# Patient Record
Sex: Female | Born: 1965
Health system: Southern US, Community
[De-identification: ages and names within clinical notes are randomized; demographics above are authoritative.]

## PROBLEM LIST (undated history)

## (undated) DIAGNOSIS — G4700 Insomnia, unspecified: Secondary | ICD-10-CM

## (undated) DIAGNOSIS — E559 Vitamin D deficiency, unspecified: Secondary | ICD-10-CM

## (undated) DIAGNOSIS — E669 Obesity, unspecified: Secondary | ICD-10-CM

## (undated) DIAGNOSIS — H269 Unspecified cataract: Secondary | ICD-10-CM

## (undated) DIAGNOSIS — E785 Hyperlipidemia, unspecified: Secondary | ICD-10-CM

## (undated) DIAGNOSIS — I10 Essential (primary) hypertension: Secondary | ICD-10-CM

## (undated) DIAGNOSIS — M1990 Unspecified osteoarthritis, unspecified site: Secondary | ICD-10-CM

## (undated) DIAGNOSIS — M549 Dorsalgia, unspecified: Secondary | ICD-10-CM

## (undated) DIAGNOSIS — N920 Excessive and frequent menstruation with regular cycle: Secondary | ICD-10-CM

## (undated) HISTORY — DX: Excessive and frequent menstruation with regular cycle: N92.0

## (undated) HISTORY — DX: Unspecified cataract: H26.9

## (undated) HISTORY — DX: Obesity, unspecified: E66.9

## (undated) HISTORY — DX: Vitamin D deficiency, unspecified: E55.9

## (undated) HISTORY — DX: Unspecified osteoarthritis, unspecified site: M19.90

## (undated) HISTORY — DX: Hyperlipidemia, unspecified: E78.5

## (undated) HISTORY — PX: CERVICAL CERCLAGE: SHX1329

## (undated) HISTORY — DX: Dorsalgia, unspecified: M54.9

## (undated) HISTORY — DX: Essential (primary) hypertension: I10

## (undated) HISTORY — PX: EYE SURGERY: SHX253

## (undated) HISTORY — PX: OTHER SURGICAL HISTORY: SHX169

## (undated) HISTORY — DX: Insomnia, unspecified: G47.00

## (undated) HISTORY — PX: BREAST SURGERY: SHX581

---

## 2005-09-06 ENCOUNTER — Ambulatory Visit: Payer: Self-pay | Admitting: Ophthalmology

## 2005-09-10 ENCOUNTER — Ambulatory Visit: Payer: Self-pay | Admitting: Ophthalmology

## 2006-01-07 HISTORY — PX: DILATION AND CURETTAGE OF UTERUS: SHX78

## 2006-06-18 ENCOUNTER — Ambulatory Visit: Payer: Self-pay | Admitting: Family Medicine

## 2006-08-12 ENCOUNTER — Ambulatory Visit: Payer: Self-pay

## 2006-08-14 ENCOUNTER — Ambulatory Visit: Payer: Self-pay

## 2009-03-04 ENCOUNTER — Emergency Department: Payer: Self-pay | Admitting: Emergency Medicine

## 2010-02-23 ENCOUNTER — Ambulatory Visit: Payer: Self-pay | Admitting: Ophthalmology

## 2010-03-06 ENCOUNTER — Ambulatory Visit: Payer: Self-pay | Admitting: Ophthalmology

## 2010-09-12 ENCOUNTER — Ambulatory Visit: Payer: Self-pay

## 2011-01-08 LAB — LIPID PANEL
Cholesterol: 189 mg/dL (ref 0–200)
HDL: 58 mg/dL (ref 35–70)
LDL Cholesterol: 114 mg/dL
Triglycerides: 87 mg/dL (ref 40–160)

## 2011-10-08 LAB — HM PAP SMEAR: HM Pap smear: NORMAL

## 2011-11-13 ENCOUNTER — Ambulatory Visit: Payer: Self-pay | Admitting: Obstetrics and Gynecology

## 2012-05-05 ENCOUNTER — Ambulatory Visit: Payer: Self-pay | Admitting: Ophthalmology

## 2014-08-31 ENCOUNTER — Other Ambulatory Visit: Payer: Self-pay | Admitting: Family Medicine

## 2014-08-31 MED ORDER — TEMAZEPAM 15 MG PO CAPS: 15.0000 mg | ORAL_CAPSULE | Freq: Every evening | ORAL | Status: DC | PRN

## 2014-08-31 MED ORDER — AMLODIPINE BESYLATE 5 MG PO TABS: 5.0000 mg | ORAL_TABLET | Freq: Every day | ORAL | Status: DC

## 2014-08-31 NOTE — Telephone Encounter (Signed)
Pt needs refill on Telmisa and Amlodepine to be sent to [facility]. Pt has an appt on 09/20/14. Last appt was 02/28/14. Pt states it is ok to leave a message if she does not answer.

## 2014-08-31 NOTE — Telephone Encounter (Signed)
Patient has a upcoming appointment on 09/20/14 at 3:15 p.m.

## 2014-09-15 ENCOUNTER — Other Ambulatory Visit: Payer: Self-pay | Admitting: Family Medicine

## 2014-09-15 MED ORDER — TELMISARTAN-HCTZ 80-25 MG PO TABS: 1.0000 | ORAL_TABLET | Freq: Every day | ORAL | Status: DC

## 2014-09-15 NOTE — Telephone Encounter (Signed)
Pt needs refill on Telmisa to be sent to a local pharmacy due to the fact she has been out since last week. [facility] [address].

## 2014-09-15 NOTE — Telephone Encounter (Signed)
Patient is checking status on her telmisa prescription

## 2014-09-17 ENCOUNTER — Encounter: Payer: Self-pay | Admitting: Family Medicine

## 2014-09-17 DIAGNOSIS — G8929 Other chronic pain: Secondary | ICD-10-CM | POA: Insufficient documentation

## 2014-09-17 DIAGNOSIS — I10 Essential (primary) hypertension: Secondary | ICD-10-CM | POA: Insufficient documentation

## 2014-09-17 DIAGNOSIS — G4700 Insomnia, unspecified: Secondary | ICD-10-CM | POA: Insufficient documentation

## 2014-09-17 DIAGNOSIS — Z9849 Cataract extraction status, unspecified eye: Secondary | ICD-10-CM | POA: Insufficient documentation

## 2014-09-17 DIAGNOSIS — H209 Unspecified iridocyclitis: Secondary | ICD-10-CM | POA: Insufficient documentation

## 2014-09-17 DIAGNOSIS — M170 Bilateral primary osteoarthritis of knee: Secondary | ICD-10-CM | POA: Insufficient documentation

## 2014-09-17 DIAGNOSIS — Z8742 Personal history of other diseases of the female genital tract: Secondary | ICD-10-CM | POA: Insufficient documentation

## 2014-09-17 DIAGNOSIS — M549 Dorsalgia, unspecified: Secondary | ICD-10-CM

## 2014-09-17 DIAGNOSIS — E559 Vitamin D deficiency, unspecified: Secondary | ICD-10-CM | POA: Insufficient documentation

## 2014-09-17 DIAGNOSIS — E785 Hyperlipidemia, unspecified: Secondary | ICD-10-CM | POA: Insufficient documentation

## 2014-09-20 ENCOUNTER — Ambulatory Visit (INDEPENDENT_AMBULATORY_CARE_PROVIDER_SITE_OTHER): Payer: BLUE CROSS/BLUE SHIELD | Admitting: Family Medicine

## 2014-09-20 ENCOUNTER — Encounter: Payer: Self-pay | Admitting: Family Medicine

## 2014-09-20 VITALS — BP 124/86 | HR 77 | Temp 98.4°F | Resp 16 | Ht 65.0 in | Wt 255.7 lb

## 2014-09-20 DIAGNOSIS — I10 Essential (primary) hypertension: Secondary | ICD-10-CM

## 2014-09-20 DIAGNOSIS — Z23 Encounter for immunization: Secondary | ICD-10-CM

## 2014-09-20 DIAGNOSIS — M170 Bilateral primary osteoarthritis of knee: Secondary | ICD-10-CM | POA: Diagnosis not present

## 2014-09-20 DIAGNOSIS — E785 Hyperlipidemia, unspecified: Secondary | ICD-10-CM

## 2014-09-20 DIAGNOSIS — E66813 Obesity, class 3: Secondary | ICD-10-CM

## 2014-09-20 DIAGNOSIS — Z114 Encounter for screening for human immunodeficiency virus [HIV]: Secondary | ICD-10-CM

## 2014-09-20 DIAGNOSIS — G4700 Insomnia, unspecified: Secondary | ICD-10-CM

## 2014-09-20 DIAGNOSIS — E559 Vitamin D deficiency, unspecified: Secondary | ICD-10-CM

## 2014-09-20 MED ORDER — TELMISARTAN-HCTZ 80-25 MG PO TABS: 1.0000 | ORAL_TABLET | Freq: Every day | ORAL | Status: DC

## 2014-09-20 NOTE — Progress Notes (Signed)
Name: Deanna Perkins   MRN: [id number]    DOB: 1965-06-06   Date:09/20/2014       Progress Note  Subjective  Chief Complaint  Chief Complaint  Patient presents with  . Medication Refill    follow-up  . Hypertension    headaches because ran out of medicatio  . Insomnia    only takes prn  . Foot Swelling    fell back in august went to urgent care states she broke 2 toes. but now having swelling and some pain in right foot    HPI  HTN: taking medication daily and denies side effects of medication. No chest pain or palpitation  Insomnia: taking medication prn and it works well for her. No side effects of medication   Dyslipidemia: she is not on medication, we need to check labs, she has difficulty taking time off work, advised to bring FMLA forms to be filled out  OA knees: she sees Dr. Katrinka Blazing and Ronie Spies, and is not a candidate for knee replacement at this time.   Foot injury: fracture of toes, seen by urgent care, able to bear weight now, but still has some edema and pain. Never seen by Podiatrist and also Ortho Boot was broken and she was never able to use it.   Patient Active Problem List   Diagnosis Date Noted  . Obesity, Class III, BMI 40-49.9 (morbid obesity) 09/20/2014  . Back pain, chronic 09/17/2014  . Benign essential HTN 09/17/2014  . Cataract 09/17/2014  . Dyslipidemia 09/17/2014  . Iridocyclitis 09/17/2014  . Osteoarthritis of both knees 09/17/2014  . Excess, menstruation 09/17/2014  . Insomnia 09/17/2014  . Vitamin D deficiency 09/17/2014    Past Surgical History  Procedure Laterality Date  . Dilation and curettage of uterus  2008  . Eye surgery Bilateral     cataracts  . Foot suregry    . Breast surgery      reduction  . Cervical cerclage      Family History  Problem Relation Age of Onset  . Diabetes Mother   . Hypertension Mother   . Stroke Mother   . Cancer Father     lung  . Hypertension Father   . Cancer Maternal Aunt     breast     Social History   Social History  . Marital Status: Single    Spouse Name: N/A  . Number of Children: N/A  . Years of Education: N/A   Occupational History  . Not on file.   Social History Main Topics  . Smoking status: Never Smoker   . Smokeless tobacco: Never Used  . Alcohol Use: 0.0 oz/week    0 Standard drinks or equivalent per week  . Drug Use: No  . Sexual Activity: Not Currently   Other Topics Concern  . Not on file   Social History Narrative     Current outpatient prescriptions:  .  amLODipine (NORVASC) 5 MG tablet, Take 1 tablet (5 mg total) by mouth daily. At bedtime, Disp: 90 tablet, Rfl: 0 .  levonorgestrel (MIRENA, 52 MG,) 20 MCG/24HR IUD, by Intrauterine route., Disp: , Rfl:  .  telmisartan-hydrochlorothiazide (MICARDIS HCT) 80-25 MG per tablet, Take 1 tablet by mouth daily., Disp: 90 tablet, Rfl: 1 .  temazepam (RESTORIL) 15 MG capsule, Take 1 capsule (15 mg total) by mouth at bedtime as needed for sleep., Disp: 90 capsule, Rfl: 0  Allergies  Allergen Reactions  . Chocolate Hives     ROS  Constitutional: Negative for fever or weight change.  Respiratory: Negative for cough and shortness of breath.   Cardiovascular: Negative for chest pain or palpitations.  Gastrointestinal: Negative for abdominal pain, no bowel changes.  Musculoskeletal: Positive for gait problem positive  joint swelling.  Skin: Negative for rash.  Neurological: Negative for dizziness or headache.  No other specific complaints in a complete review of systems (except as listed in HPI above).  Objective  Filed Vitals:   09/20/14 1527  BP: 124/86  Pulse: 77  Temp: 98.4 F (36.9 C)  TempSrc: Oral  Resp: 16  Height: 5\' 5"  (1.651 m)  Weight: 255 lb 11.2 oz (115.985 kg)  SpO2: 98%    Body mass index is 42.55 kg/(m^2).  Physical Exam  Constitutional: Patient appears well-developed and well-nourished. Obese No distress.  HEENT: head atraumatic, normocephalic, pupils  equal and reactive to light, , neck supple, throat within normal limits Cardiovascular: Normal rate, regular rhythm and normal heart sounds.  No murmur heard. No BLE edema. Pulmonary/Chest: Effort normal and breath sounds normal. No respiratory distress. Abdominal: Soft.  There is no tenderness. Psychiatric: Patient has a normal mood and affect. behavior is normal. Judgment and thought content normal. Muscular Skeletal: crepitus with extension of left knee, but normal right knee exam, except for pain with extension, pain during palpation of right lateral forefoot , no edema or erythema Skin: hyperpigmentation from picking on her skin - she does not want medication for stress at this time  PHQ2/9: Depression screen [facility] 2/9 09/20/2014  Decreased Interest 0  Down, Depressed, Hopeless 0  PHQ - 2 Score 0    Fall Risk: Fall Risk  09/20/2014  Falls in the past year? Yes  Number falls in past yr: 2 or more  Injury with Fall? Yes     Functional Status Survey: Is the patient deaf or have difficulty hearing?: No Does the patient have difficulty seeing, even when wearing glasses/contacts?: Yes (glasses) Does the patient have difficulty concentrating, remembering, or making decisions?: No Does the patient have difficulty walking or climbing stairs?: No Does the patient have difficulty dressing or bathing?: No Does the patient have difficulty doing errands alone such as visiting a doctor's office or shopping?: No   Assessment & Plan  1. Benign essential HTN  At goal, needs follow up at least three times yearly, needs FMLA form filled out - telmisartan-hydrochlorothiazide (MICARDIS HCT) 80-25 MG per tablet; Take 1 tablet by mouth daily.  Dispense: 90 tablet; Refill: 1 - Comprehensive metabolic panel  2. Needs flu shot  - Flu Vaccine QUAD 36+ mos PF IM (Fluarix & Fluzone Quad PF) - refused  3. Dyslipidemia  - Lipid panel  4. Insomnia  Taking medication prn   5. Vitamin D  deficiency  Recheck labs  6. Primary osteoarthritis of both knees  Needs PT, but does not have time off from work, advised FMLA forms  7. Obesity, Class III, BMI 40-49.9 (morbid obesity)  Discussed with the patient the risk posed by an increased BMI. Discussed importance of portion control, calorie counting and at least 150 minutes of physical activity weekly. Avoid sweet beverages and drink more water. Eat at least 6 servings of fruit and vegetables daily   8. Encounter for screening for HIV  - HIV antibody

## 2014-10-06 ENCOUNTER — Other Ambulatory Visit: Payer: Self-pay | Admitting: Family Medicine

## 2014-10-06 NOTE — Telephone Encounter (Signed)
Patient requesting refill. 

## 2014-10-06 NOTE — Telephone Encounter (Signed)
When is her follow up. [facility]

## 2014-11-30 ENCOUNTER — Other Ambulatory Visit: Payer: Self-pay | Admitting: Family Medicine

## 2014-11-30 NOTE — Telephone Encounter (Signed)
Patient requesting refill. 

## 2014-11-30 NOTE — Telephone Encounter (Signed)
Left voice message for patient to return call and schedule appointment.

## 2014-12-05 ENCOUNTER — Other Ambulatory Visit: Payer: Self-pay | Admitting: Family Medicine

## 2014-12-05 NOTE — Telephone Encounter (Signed)
Left voice mail to inform patient of prescription being called in and that she need to schedule appointment.

## 2015-03-01 ENCOUNTER — Ambulatory Visit (INDEPENDENT_AMBULATORY_CARE_PROVIDER_SITE_OTHER): Payer: BLUE CROSS/BLUE SHIELD | Admitting: Family Medicine

## 2015-03-01 ENCOUNTER — Encounter: Payer: Self-pay | Admitting: Family Medicine

## 2015-03-01 VITALS — BP 126/86 | HR 78 | Temp 98.1°F | Resp 16 | Ht 65.0 in | Wt 262.0 lb

## 2015-03-01 DIAGNOSIS — E559 Vitamin D deficiency, unspecified: Secondary | ICD-10-CM

## 2015-03-01 DIAGNOSIS — E785 Hyperlipidemia, unspecified: Secondary | ICD-10-CM | POA: Diagnosis not present

## 2015-03-01 DIAGNOSIS — I10 Essential (primary) hypertension: Secondary | ICD-10-CM | POA: Diagnosis not present

## 2015-03-01 DIAGNOSIS — M549 Dorsalgia, unspecified: Secondary | ICD-10-CM

## 2015-03-01 DIAGNOSIS — M170 Bilateral primary osteoarthritis of knee: Secondary | ICD-10-CM | POA: Diagnosis not present

## 2015-03-01 DIAGNOSIS — M5416 Radiculopathy, lumbar region: Secondary | ICD-10-CM

## 2015-03-01 DIAGNOSIS — F330 Major depressive disorder, recurrent, mild: Secondary | ICD-10-CM

## 2015-03-01 DIAGNOSIS — G8929 Other chronic pain: Secondary | ICD-10-CM | POA: Diagnosis not present

## 2015-03-01 DIAGNOSIS — G4700 Insomnia, unspecified: Secondary | ICD-10-CM

## 2015-03-01 DIAGNOSIS — M5417 Radiculopathy, lumbosacral region: Secondary | ICD-10-CM | POA: Diagnosis not present

## 2015-03-01 MED ORDER — TEMAZEPAM 15 MG PO CAPS: 15.0000 mg | ORAL_CAPSULE | Freq: Every day | ORAL | Status: DC

## 2015-03-01 MED ORDER — TELMISARTAN-HCTZ 80-25 MG PO TABS: 1.0000 | ORAL_TABLET | Freq: Every day | ORAL | Status: DC

## 2015-03-01 MED ORDER — CITALOPRAM HYDROBROMIDE 20 MG PO TABS: 20.0000 mg | ORAL_TABLET | Freq: Every day | ORAL | Status: DC

## 2015-03-01 MED ORDER — PREDNISONE 10 MG (48) PO TBPK: ORAL_TABLET | Freq: Every day | ORAL | Status: DC

## 2015-03-01 MED ORDER — AMLODIPINE BESYLATE 5 MG PO TABS: ORAL_TABLET | ORAL | Status: DC

## 2015-03-01 NOTE — Progress Notes (Signed)
Name: Deanna Perkins   MRN: [id number]    DOB: 10-Aug-1965   Date:03/01/2015       Progress Note  Subjective  Chief Complaint  Chief Complaint  Patient presents with  . Hypertension    patient is doing fine.  . Medication Refill    temazepam  . Back Pain    flare-up. patient stated that her pain has worsened. it has actually given out on her a few times. the pain radiates down the left leg.     HPI  HTN: taking medication daily and denies side effects of medication. No chest pain or palpitation. No SOB  Insomnia: taking medication prn and it works well for her. No side effects of medication. She has been sleeping at least 7 hours with medication   Dyslipidemia: she is not on medication, we need to check labs - but she states she had it at work and will send me a copy.  OA knees: she sees Dr. Katrinka Blazing and Ronie Spies, and is not a candidate for knee replacement at this time.   Lumbar spine Radiculitis: she has a long history of low back pain, since 2012, however since 2015  she has noticed right lower extremity radiculitis since the beginning, symptoms of burning , aching going down her lateral thigh and foot is getting worse. She states Dr. Katrinka Blazing ordered back  X-rays that showed OA, but no further testing. She has occasionally weakness on right leg - when she walks for a long time or sitting for a prolonged period of time.   Major Depression Mild: she has been off Citalopram for over 3 years, but she states she is under more stress at home ( planning a family reunion ) also at work ( harsh boss ), her own personal health and raising her teenager daughter.   Patient Active Problem List   Diagnosis Date Noted  . Obesity, Class III, BMI 40-49.9 (morbid obesity) (HCC) 09/20/2014  . Back pain, chronic 09/17/2014  . Benign essential HTN 09/17/2014  . History of cataract surgery 09/17/2014  . Dyslipidemia 09/17/2014  . Iridocyclitis 09/17/2014  . Osteoarthritis of both knees 09/17/2014   . History of menorrhagia 09/17/2014  . Insomnia 09/17/2014  . Vitamin D deficiency 09/17/2014    Past Surgical History  Procedure Laterality Date  . Dilation and curettage of uterus  2008  . Eye surgery Bilateral     cataracts  . Foot suregry    . Breast surgery      reduction  . Cervical cerclage      Family History  Problem Relation Age of Onset  . Diabetes Mother   . Hypertension Mother   . Stroke Mother   . Cancer Father     lung  . Hypertension Father   . Cancer Maternal Aunt     breast    Social History   Social History  . Marital Status: Single    Spouse Name: N/A  . Number of Children: N/A  . Years of Education: N/A   Occupational History  . Not on file.   Social History Main Topics  . Smoking status: Never Smoker   . Smokeless tobacco: Never Used  . Alcohol Use: 0.0 oz/week    0 Standard drinks or equivalent per week  . Drug Use: No  . Sexual Activity: Not Currently   Other Topics Concern  . Not on file   Social History Narrative     Current outpatient prescriptions:  .  amLODipine (  NORVASC) 5 MG tablet, Daily for bp, Disp: 90 tablet, Rfl: 0 .  levonorgestrel (MIRENA, 52 MG,) 20 MCG/24HR IUD, by Intrauterine route., Disp: , Rfl:  .  telmisartan-hydrochlorothiazide (MICARDIS HCT) 80-25 MG tablet, Take 1 tablet by mouth daily., Disp: 90 tablet, Rfl: 1 .  temazepam (RESTORIL) 15 MG capsule, Take 1 capsule (15 mg total) by mouth at bedtime., Disp: 90 capsule, Rfl: 0 .  predniSONE (STERAPRED UNI-PAK 48 TAB) 10 MG (48) TBPK tablet, Take by mouth daily. Take as directed, Disp: 48 tablet, Rfl: 0  Allergies  Allergen Reactions  . Chocolate Hives     ROS  Constitutional: Negative for fever , positive for  weight change.  Respiratory: Negative for cough and shortness of breath.   Cardiovascular: Negative for chest pain or palpitations.  Gastrointestinal: Negative for abdominal pain, no bowel changes.  Musculoskeletal: Positive  for gait problem  no  joint swelling.  Skin: Negative for rash.  Neurological: Negative for dizziness or headache.  No other specific complaints in a complete review of systems (except as listed in HPI above).  Objective  Filed Vitals:   03/01/15 0956  BP: 126/86  Pulse: 78  Temp: 98.1 F (36.7 C)  TempSrc: Oral  Resp: 16  Height: 5\' 5"  (1.651 m)  Weight: 262 lb (118.842 kg)  SpO2: 96%    Body mass index is 43.6 kg/(m^2).  Physical Exam  Constitutional: Patient appears well-developed and well-nourished. Obese  No distress.  HEENT: head atraumatic, normocephalic, pupils equal and reactive to light,  neck supple, throat within normal limits Cardiovascular: Normal rate, regular rhythm and normal heart sounds.  No murmur heard. No BLE edema. Pulmonary/Chest: Effort normal and breath sounds normal. No respiratory distress. Abdominal: Soft.  There is no tenderness. Psychiatric: Patient has a normal mood and affect. behavior is normal. Judgment and thought content normal. Muscular Skeletal: tender right lower back, positive straight leg raise on the right, normal sensation   PHQ2/9: Depression screen [facility] 2/9 03/01/2015 09/20/2014  Decreased Interest 0 0  Down, Depressed, Hopeless 0 0  PHQ - 2 Score 0 0     Fall Risk: Fall Risk  03/01/2015 09/20/2014  Falls in the past year? Yes Yes  Number falls in past yr: 2 or more 2 or more  Injury with Fall? Yes Yes  Follow up Falls evaluation completed;Falls prevention discussed -    Functional Status Survey: Is the patient deaf or have difficulty hearing?: No Does the patient have difficulty seeing, even when wearing glasses/contacts?: No Does the patient have difficulty concentrating, remembering, or making decisions?: No Does the patient have difficulty walking or climbing stairs?: Yes (due to back pain) Does the patient have difficulty dressing or bathing?: No Does the patient have difficulty doing errands alone such as visiting a doctor's office or  shopping?: No    Assessment & Plan  1. Dyslipidemia  Advised to bring me a copy of her labs  2. Insomnia  - temazepam (RESTORIL) 15 MG capsule; Take 1 capsule (15 mg total) by mouth at bedtime.  Dispense: 90 capsule; Refill: 0  3. Obesity, Class III, BMI 40-49.9 (morbid obesity) (HCC)  Discussed with the patient the risk posed by an increased BMI. Discussed importance of portion control, calorie counting and at least 150 minutes of physical activity weekly. Avoid sweet beverages and drink more water. Eat at least 6 servings of fruit and vegetables daily   4. Benign essential HTN  - telmisartan-hydrochlorothiazide (MICARDIS HCT) 80-25 MG tablet; Take 1  tablet by mouth daily.  Dispense: 90 tablet; Refill: 1 - amLODipine (NORVASC) 5 MG tablet; Daily for bp  Dispense: 90 tablet; Refill: 0  5. Primary osteoarthritis of both knees  Keep follow up with Dr. Donnetta Hail, hold all NSAID's while taking Prednisone  6. Vitamin D deficiency  Needs to take vitamin D otc  7. Back pain, chronic  - predniSONE (STERAPRED UNI-PAK 48 TAB) 10 MG (48) TBPK tablet; Take by mouth daily. Take as directed  Dispense: 48 tablet; Refill: 0  8. Right lumbar radiculitis  - predniSONE (STERAPRED UNI-PAK 48 TAB) 10 MG (48) TBPK tablet; Take by mouth daily. Take as directed  Dispense: 48 tablet; Refill: 0  9. Major Depressive Mild  We will resume medication  - citalopram (CELEXA) 20 MG tablet; Take 1 tablet (20 mg total) by mouth daily.  Dispense: 90 tablet; Refill: 0

## 2015-03-01 NOTE — Patient Instructions (Signed)
Radicular Pain °Radicular pain in either the arm or leg is usually from a bulging or herniated disk in the spine. A piece of the herniated disk may press against the nerves as the nerves exit the spine. This causes pain which is felt at the tips of the nerves down the arm or leg. Other causes of radicular pain may include: °· Fractures. °· Heart disease. °· Cancer. °· An abnormal and usually degenerative state of the nervous system or nerves (neuropathy). °Diagnosis may require CT or MRI scanning to determine the primary cause.  °Nerves that start at the neck (nerve roots) may cause radicular pain in the outer shoulder and arm. It can spread down to the thumb and fingers. The symptoms vary depending on which nerve root has been affected. In most cases radicular pain improves with conservative treatment. Neck problems may require physical therapy, a neck collar, or cervical traction. Treatment may take many weeks, and surgery may be considered if the symptoms do not improve.  °Conservative treatment is also recommended for sciatica. Sciatica causes pain to radiate from the lower back or buttock area down the leg into the foot. Often there is a history of back problems. Most patients with sciatica are better after 2 to 4 weeks of rest and other supportive care. Short term bed rest can reduce the disk pressure considerably. Sitting, however, is not a good position since this increases the pressure on the disk. You should avoid bending, lifting, and all other activities which make the problem worse. Traction can be used in severe cases. Surgery is usually reserved for patients who do not improve within the first months of treatment. °Only take over-the-counter or prescription medicines for pain, discomfort, or fever as directed by your caregiver. Narcotics and muscle relaxants may help by relieving more severe pain and spasm and by providing mild sedation. Cold or massage can give significant relief. Spinal manipulation  is not recommended. It can increase the degree of disc protrusion. Epidural steroid injections are often effective treatment for radicular pain. These injections deliver medicine to the spinal nerve in the space between the protective covering of the spinal cord and back bones (vertebrae). Your caregiver can give you more information about steroid injections. These injections are most effective when given within two weeks of the onset of pain.  °You should see your caregiver for follow up care as recommended. A program for neck and back injury rehabilitation with stretching and strengthening exercises is an important part of management.  °SEEK IMMEDIATE MEDICAL CARE IF: °· You develop increased pain, weakness, or numbness in your arm or leg. °· You develop difficulty with bladder or bowel control. °· You develop abdominal pain. °  °This information is not intended to replace advice given to you by your health care provider. Make sure you discuss any questions you have with your health care provider. °  °Document Released: 02/01/2004 Document Revised: 01/14/2014 Document Reviewed: 07/20/2014 °[employer]vier Interactive Patient Education ©2016 [employer] ° °

## 2015-05-15 DIAGNOSIS — R262 Difficulty in walking, not elsewhere classified: Secondary | ICD-10-CM | POA: Diagnosis not present

## 2015-05-15 DIAGNOSIS — M545 Low back pain: Secondary | ICD-10-CM | POA: Diagnosis not present

## 2015-05-15 DIAGNOSIS — M25551 Pain in right hip: Secondary | ICD-10-CM | POA: Diagnosis not present

## 2015-05-15 DIAGNOSIS — M6281 Muscle weakness (generalized): Secondary | ICD-10-CM | POA: Diagnosis not present

## 2015-05-24 ENCOUNTER — Ambulatory Visit (INDEPENDENT_AMBULATORY_CARE_PROVIDER_SITE_OTHER): Payer: BLUE CROSS/BLUE SHIELD | Admitting: Family Medicine

## 2015-05-24 ENCOUNTER — Encounter: Payer: Self-pay | Admitting: Family Medicine

## 2015-05-24 VITALS — BP 128/70 | HR 97 | Temp 97.7°F | Resp 16 | Ht 65.0 in | Wt 262.3 lb

## 2015-05-24 DIAGNOSIS — G4700 Insomnia, unspecified: Secondary | ICD-10-CM | POA: Diagnosis not present

## 2015-05-24 DIAGNOSIS — M5417 Radiculopathy, lumbosacral region: Secondary | ICD-10-CM

## 2015-05-24 DIAGNOSIS — E559 Vitamin D deficiency, unspecified: Secondary | ICD-10-CM | POA: Diagnosis not present

## 2015-05-24 DIAGNOSIS — F330 Major depressive disorder, recurrent, mild: Secondary | ICD-10-CM

## 2015-05-24 DIAGNOSIS — M5416 Radiculopathy, lumbar region: Secondary | ICD-10-CM

## 2015-05-24 DIAGNOSIS — I10 Essential (primary) hypertension: Secondary | ICD-10-CM

## 2015-05-24 MED ORDER — TEMAZEPAM 15 MG PO CAPS: 15.0000 mg | ORAL_CAPSULE | Freq: Every day | ORAL | Status: DC

## 2015-05-24 MED ORDER — AMLODIPINE-VALSARTAN-HCTZ 5-160-25 MG PO TABS: 1.0000 | ORAL_TABLET | Freq: Every day | ORAL | Status: DC

## 2015-05-24 MED ORDER — CITALOPRAM HYDROBROMIDE 20 MG PO TABS: 20.0000 mg | ORAL_TABLET | Freq: Every day | ORAL | Status: DC

## 2015-05-24 NOTE — Progress Notes (Signed)
Name: Deanna Perkins   MRN: [id number]    DOB: 02/23/1965   Date:05/24/2015       Progress Note  Subjective  Chief Complaint  Chief Complaint  Patient presents with  . Medication Refill    micardis  . Back Pain    review notes from [facility]    HPI  HTN: taking medication daily and denies side effects of medication. No chest pain or palpitation. No SOB. Discussed combining medications  Insomnia: taking medication prn and it works well for her. No side effects of medication. She has been sleeping at least 7 hours with medication  No longer taking it every night.   Lumbar spine Radiculitis: she has a long history of low back pain, since 2012, however since 2015 she has noticed right lower extremity radiculitis since the beginning, symptoms of burning , aching going down her lateral thigh and foot is getting worse. She states Dr. Katrinka Blazing ordered back X-rays that showed OA. She tried prednisone without help. Currently seeing PT but still has constant pain on lower back and intermittent radiculitis.. She has occasionally weakness on right leg - when she walks for a long time or sitting for a prolonged period of time.   Major Depression Mild: she had  been off Citalopram for over 3 years, she is back on Citalopram since Feb because of increase in stress, raising teenage daughter and stress at job. She is currently not taking it daily, explained importance of compliance.   Obesity: weight has been stable, she has been eating more fruit and vegetables, cutting down on eating out.   Patient Active Problem List   Diagnosis Date Noted  . Obesity, Class III, BMI 40-49.9 (morbid obesity) (HCC) 09/20/2014  . Back pain, chronic 09/17/2014  . Benign essential HTN 09/17/2014  . History of cataract surgery 09/17/2014  . Dyslipidemia 09/17/2014  . Iridocyclitis 09/17/2014  . Osteoarthritis of both knees 09/17/2014  . History of menorrhagia 09/17/2014  . Insomnia 09/17/2014  . Vitamin D  deficiency 09/17/2014    Past Surgical History  Procedure Laterality Date  . Dilation and curettage of uterus  2008  . Eye surgery Bilateral     cataracts  . Foot suregry    . Breast surgery      reduction  . Cervical cerclage      Family History  Problem Relation Age of Onset  . Diabetes Mother   . Hypertension Mother   . Stroke Mother   . Cancer Father     lung  . Hypertension Father   . Cancer Maternal Aunt     breast    Social History   Social History  . Marital Status: Single    Spouse Name: N/A  . Number of Children: N/A  . Years of Education: N/A   Occupational History  . Not on file.   Social History Main Topics  . Smoking status: Never Smoker   . Smokeless tobacco: Never Used  . Alcohol Use: 0.0 oz/week    0 Standard drinks or equivalent per week  . Drug Use: No  . Sexual Activity: Not Currently   Other Topics Concern  . Not on file   Social History Narrative     Current outpatient prescriptions:  .  amLODipine (NORVASC) 5 MG tablet, Daily for bp, Disp: 90 tablet, Rfl: 0 .  citalopram (CELEXA) 20 MG tablet, Take 1 tablet (20 mg total) by mouth daily., Disp: 90 tablet, Rfl: 0 .  levonorgestrel (MIRENA,  52 MG,) 20 MCG/24HR IUD, by Intrauterine route., Disp: , Rfl:  .  telmisartan-hydrochlorothiazide (MICARDIS HCT) 80-25 MG tablet, Take 1 tablet by mouth daily., Disp: 90 tablet, Rfl: 1 .  temazepam (RESTORIL) 15 MG capsule, Take 1 capsule (15 mg total) by mouth at bedtime., Disp: 90 capsule, Rfl: 0  Allergies  Allergen Reactions  . Chocolate Hives     ROS  Ten systems reviewed and is negative except as mentioned in HPI  Antalgic gait  Objective  Filed Vitals:   05/24/15 0907  BP: 128/70  Pulse: 97  Temp: 97.7 F (36.5 C)  TempSrc: Oral  Resp: 16  Height:  (1.651 m)  Weight: 262 lb 4.8 oz (118.978 kg)  SpO2: 96%    Body mass index is 43.65 kg/(m^2).  Physical Exam  Constitutional: Patient appears well-developed and  well-nourished. Obese No distress.  HEENT: head atraumatic, normocephalic, pupils equal and reactive to light, neck supple, throat within normal limits Cardiovascular: Normal rate, regular rhythm and normal heart sounds. No murmur heard. No BLE edema. Pulmonary/Chest: Effort normal and breath sounds normal. No respiratory distress. Abdominal: Soft. There is no tenderness. Psychiatric: Patient has a normal mood and affect. behavior is normal. Judgment and thought content normal. Muscular Skeletal: tender right lower back, negative straight leg raise on the right, normal sensation    PHQ2/9: Depression screen [facility] 2/9 05/24/2015 03/01/2015 09/20/2014  Decreased Interest 0 0 0  Down, Depressed, Hopeless 0 0 0  PHQ - 2 Score 0 0 0     Fall Risk: Fall Risk  05/24/2015 03/01/2015 09/20/2014  Falls in the past year? No Yes Yes  Number falls in past yr: - 2 or more 2 or more  Injury with Fall? - Yes Yes  Follow up - Falls evaluation completed;Falls prevention discussed -     Functional Status Survey: Is the patient deaf or have difficulty hearing?: No Does the patient have difficulty seeing, even when wearing glasses/contacts?: No Does the patient have difficulty concentrating, remembering, or making decisions?: No Does the patient have difficulty walking or climbing stairs?: Yes (only when her back pain flares up) Does the patient have difficulty dressing or bathing?: No Does the patient have difficulty doing errands alone such as visiting a doctor's office or shopping?: No    Assessment & Plan  1. Right lumbar radiculitis  - MR Lumbar Spine Wo Contrast; Future  2. Obesity, Class III, BMI 40-49.9 (morbid obesity) (HCC)  Discussed with the patient the risk posed by an increased BMI. Discussed importance of portion control, calorie counting and at least 150 minutes of physical activity weekly. Avoid sweet beverages and drink more water. Eat at least 6 servings of fruit and vegetables  daily   3. Benign essential HTN  - Amlodipine-Valsartan-HCTZ 5-160-25 MG TABS; Take 1 tablet by mouth daily.  Dispense: 90 tablet; Refill: 0  4. Depression, major, recurrent, mild (HCC)  - citalopram (CELEXA) 20 MG tablet; Take 1 tablet (20 mg total) by mouth daily.  Dispense: 90 tablet; Refill: 1  5. Insomnia  - temazepam (RESTORIL) 15 MG capsule; Take 1 capsule (15 mg total) by mouth at bedtime.  Dispense: 90 capsule; Refill: 0  6. Vitamin D deficiency  She will return for CPE and will have labs done

## 2015-05-26 ENCOUNTER — Other Ambulatory Visit: Payer: Self-pay | Admitting: Family Medicine

## 2015-06-01 ENCOUNTER — Encounter: Payer: Self-pay | Admitting: Family Medicine

## 2015-06-06 ENCOUNTER — Encounter: Payer: Self-pay | Admitting: Family Medicine

## 2015-06-06 ENCOUNTER — Ambulatory Visit (INDEPENDENT_AMBULATORY_CARE_PROVIDER_SITE_OTHER): Payer: BLUE CROSS/BLUE SHIELD | Admitting: Family Medicine

## 2015-06-06 VITALS — BP 134/96 | HR 73 | Temp 97.9°F | Resp 16 | Ht 62.0 in | Wt 259.9 lb

## 2015-06-06 DIAGNOSIS — Z113 Encounter for screening for infections with a predominantly sexual mode of transmission: Secondary | ICD-10-CM

## 2015-06-06 DIAGNOSIS — Z124 Encounter for screening for malignant neoplasm of cervix: Secondary | ICD-10-CM

## 2015-06-06 DIAGNOSIS — Z0000 Encounter for general adult medical examination without abnormal findings: Secondary | ICD-10-CM | POA: Diagnosis not present

## 2015-06-06 DIAGNOSIS — E559 Vitamin D deficiency, unspecified: Secondary | ICD-10-CM

## 2015-06-06 DIAGNOSIS — Z131 Encounter for screening for diabetes mellitus: Secondary | ICD-10-CM

## 2015-06-06 DIAGNOSIS — Z114 Encounter for screening for human immunodeficiency virus [HIV]: Secondary | ICD-10-CM | POA: Diagnosis not present

## 2015-06-06 DIAGNOSIS — E785 Hyperlipidemia, unspecified: Secondary | ICD-10-CM

## 2015-06-06 DIAGNOSIS — Z01419 Encounter for gynecological examination (general) (routine) without abnormal findings: Secondary | ICD-10-CM

## 2015-06-06 DIAGNOSIS — I10 Essential (primary) hypertension: Secondary | ICD-10-CM

## 2015-06-06 DIAGNOSIS — Z1239 Encounter for other screening for malignant neoplasm of breast: Secondary | ICD-10-CM

## 2015-06-06 MED ORDER — AMLODIPINE BESYLATE 5 MG PO TABS: 5.0000 mg | ORAL_TABLET | Freq: Every day | ORAL | Status: DC

## 2015-06-06 MED ORDER — TELMISARTAN-HCTZ 80-25 MG PO TABS: 1.0000 | ORAL_TABLET | Freq: Every day | ORAL | Status: DC

## 2015-06-06 NOTE — Progress Notes (Signed)
Name: Deanna Perkins   MRN: [id number]    DOB: 05/16/1965   Date:06/06/2015       Progress Note  Subjective  Chief Complaint  Chief Complaint  Patient presents with  . Annual Exam    HPI   Well Woman: she has not been sexually active in over one year, she had an IUD placed in 2013, she does not have cycles, no change in bowel movements, she has changed her diet and lost over 2 lbs in the past 2 weeks.   HTN: she went to get Exforge HCTZ but it was too expensive, so currently only taking MIcardis/HCTZ we will send rx of Norvasc to pharmacy  Patient Active Problem List   Diagnosis Date Noted  . Obesity, Class III, BMI 40-49.9 (morbid obesity) (HCC) 09/20/2014  . Back pain, chronic 09/17/2014  . Benign essential HTN 09/17/2014  . History of cataract surgery 09/17/2014  . Dyslipidemia 09/17/2014  . Iridocyclitis 09/17/2014  . Osteoarthritis of both knees 09/17/2014  . History of menorrhagia 09/17/2014  . Insomnia 09/17/2014  . Vitamin D deficiency 09/17/2014    Past Surgical History  Procedure Laterality Date  . Dilation and curettage of uterus  2008  . Eye surgery Bilateral     cataracts  . Foot suregry    . Breast surgery      reduction  . Cervical cerclage      Family History  Problem Relation Age of Onset  . Diabetes Mother   . Hypertension Mother   . Stroke Mother   . Cancer Father     lung  . Hypertension Father   . Cancer Maternal Aunt     breast    Social History   Social History  . Marital Status: Single    Spouse Name: N/A  . Number of Children: N/A  . Years of Education: N/A   Occupational History  . Not on file.   Social History Main Topics  . Smoking status: Never Smoker   . Smokeless tobacco: Never Used  . Alcohol Use: 0.0 oz/week    0 Standard drinks or equivalent per week  . Drug Use: No  . Sexual Activity: Not Currently   Other Topics Concern  . Not on file   Social History Narrative     Current outpatient prescriptions:  .   amLODipine (NORVASC) 5 MG tablet, Take 1 tablet (5 mg total) by mouth daily. for blood pressure, Disp: 90 tablet, Rfl: 0 .  citalopram (CELEXA) 20 MG tablet, Take 1 tablet (20 mg total) by mouth daily., Disp: 90 tablet, Rfl: 1 .  levonorgestrel (MIRENA, 52 MG,) 20 MCG/24HR IUD, by Intrauterine route., Disp: , Rfl:  .  prednisoLONE acetate (PRED FORTE) 1 % ophthalmic suspension, , Disp: , Rfl: 1 .  telmisartan-hydrochlorothiazide (MICARDIS HCT) 80-25 MG tablet, Take 1 tablet by mouth daily., Disp: 90 tablet, Rfl: 0 .  temazepam (RESTORIL) 15 MG capsule, Take 1 capsule (15 mg total) by mouth at bedtime., Disp: 90 capsule, Rfl: 0  Allergies  Allergen Reactions  . Chocolate Hives     ROS  Constitutional: Negative for fever , positive for mild weight change.  Respiratory: Negative for cough and shortness of breath.   Cardiovascular: Negative for chest pain or palpitations.  Gastrointestinal: Negative for abdominal pain, no bowel changes.  Musculoskeletal: positive  for gait problem ( secondary to back pain )or joint swelling.  Skin: Negative for rash.  Neurological: Negative for dizziness or headache.  No other specific  complaints in a complete review of systems (except as listed in HPI above).  Objective  Filed Vitals:   06/06/15 1114  BP: 134/96  Pulse: 73  Temp: 97.9 F (36.6 C)  TempSrc: Oral  Resp: 16  Height:  (1.575 m)  Weight: 259 lb 14.4 oz (117.89 kg)  SpO2: 98%    Body mass index is 47.52 kg/(m^2).  Physical Exam  Constitutional: Patient appears well-developed and well-nourished, obese. No distress.  HENT: Head: Normocephalic and atraumatic. Ears: B TMs ok, no erythema or effusion; Nose: Nose normal. Mouth/Throat: Oropharynx is clear and moist. No oropharyngeal exudate.  Eyes: Conjunctivae and EOM are normal. Pupils are equal, round, and reactive to light. No scleral icterus.  Neck: Normal range of motion. Neck supple. No JVD present. No thyromegaly present.   Cardiovascular: Normal rate, regular rhythm and normal heart sounds.  No murmur heard. No BLE edema. Pulmonary/Chest: Effort normal and breath sounds normal. No respiratory distress. Abdominal: Soft. Bowel sounds are normal, no distension. There is no tenderness. no masses Breast: no lumps or masses, no nipple discharge or rashes FEMALE GENITALIA:  External genitalia normal External urethra normal Vaginal vault normal without discharge or lesions Cervix normal without discharge or lesions, IUD strings in place Bimanual exam normal without masses RECTAL: not done Musculoskeletal: Normal range of motion, no joint effusions. No gross deformities Neurological: he is alert and oriented to person, place, and time. No cranial nerve deficit. Coordination, balance, strength, speech and gait are normal.  Skin: Skin is warm and dry. No rash noted. No erythema.  Psychiatric: Patient has a normal mood and affect. behavior is normal. Judgment and thought content normal.  PHQ2/9: Depression screen [facility] 2/9 05/24/2015 03/01/2015 09/20/2014  Decreased Interest 0 0 0  Down, Depressed, Hopeless 0 0 0  PHQ - 2 Score 0 0 0     Fall Risk: Fall Risk  05/24/2015 03/01/2015 09/20/2014  Falls in the past year? No Yes Yes  Number falls in past yr: - 2 or more 2 or more  Injury with Fall? - Yes Yes  Follow up - Falls evaluation completed;Falls prevention discussed -     Assessment & Plan  1. Well woman exam  Discussed importance of 150 minutes of physical activity weekly, eat two servings of fish weekly, eat one serving of tree nuts ( cashews, pistachios, pecans, almonds.Marland Kitchen) every other day, eat 6 servings of fruit/vegetables daily and drink plenty of water and avoid sweet beverages.   - Lipid panel - CBC with Differential/Platelet - Comprehensive metabolic panel - HIV antibody - Pap IG, CT/NG NAA, and HPV (high risk) - VITAMIN D 25 Hydroxy (Vit-D Deficiency, Fractures)  2. Cervical cancer screening  -  Hemoglobin A1c - Pap IG, CT/NG NAA, and HPV (high risk)  3. Benign essential HTN  - CBC with Differential/Platelet - Comprehensive metabolic panel She has a high deductible plan, so she will monitor bp at home/work and if remains elevated she will return sooner, otherwise follow up in 6 months  4. Dyslipidemia  - Lipid panel  5. Screening for HIV (human immunodeficiency virus)  - HIV antibody  6. Routine screening for STI (sexually transmitted infection)  pap IG, CT/NG NAA, and HPV (high risk)  7. Diabetes mellitus screening  - Hemoglobin A1c  8. Vitamin D deficiency  - VITAMIN D 25 Hydroxy (Vit-D Deficiency, Fractures)  9. Breast cancer screening  - MM Digital Screening; Future

## 2015-06-07 ENCOUNTER — Telehealth: Payer: Self-pay

## 2015-06-07 NOTE — Telephone Encounter (Signed)
Called and had MRI of patient lumbar spine approved with her Insurance [employer]-customer service representative Ethelene Browns at [facility] valid from 06/07/15 through July 06, 2015 Authorization #[id number].

## 2015-06-09 ENCOUNTER — Ambulatory Visit: Payer: BLUE CROSS/BLUE SHIELD

## 2015-06-09 LAB — PAP IG, CT-NG NAA, HPV HIGH-RISK
Chlamydia, Nuc. Acid Amp: NEGATIVE
Gonococcus by Nucleic Acid Amp: NEGATIVE
HPV, high-risk: NEGATIVE
PAP Smear Comment: 0

## 2015-06-12 ENCOUNTER — Telehealth: Payer: Self-pay

## 2015-06-12 NOTE — Telephone Encounter (Signed)
Left Vm for patient to return my call.

## 2015-06-12 NOTE — Telephone Encounter (Signed)
-----   Message from Alba Cory, MD sent at 06/11/2015  6:45 PM EDT ----- Normal pap smear , HPV negative

## 2015-06-14 DIAGNOSIS — M545 Low back pain: Secondary | ICD-10-CM | POA: Diagnosis not present

## 2015-06-14 DIAGNOSIS — M25551 Pain in right hip: Secondary | ICD-10-CM | POA: Diagnosis not present

## 2015-06-14 DIAGNOSIS — R262 Difficulty in walking, not elsewhere classified: Secondary | ICD-10-CM | POA: Diagnosis not present

## 2015-06-14 DIAGNOSIS — M6281 Muscle weakness (generalized): Secondary | ICD-10-CM | POA: Diagnosis not present

## 2015-06-19 DIAGNOSIS — M6281 Muscle weakness (generalized): Secondary | ICD-10-CM | POA: Diagnosis not present

## 2015-06-19 DIAGNOSIS — R262 Difficulty in walking, not elsewhere classified: Secondary | ICD-10-CM | POA: Diagnosis not present

## 2015-06-19 DIAGNOSIS — M545 Low back pain: Secondary | ICD-10-CM | POA: Diagnosis not present

## 2015-06-19 DIAGNOSIS — M25551 Pain in right hip: Secondary | ICD-10-CM | POA: Diagnosis not present

## 2015-06-27 ENCOUNTER — Ambulatory Visit
Admission: RE | Admit: 2015-06-27 | Discharge: 2015-06-27 | Disposition: A | Discharge status: Home / Self Care | Payer: BLUE CROSS/BLUE SHIELD | Source: Ambulatory Visit | Attending: Family Medicine | Admitting: Family Medicine

## 2015-06-27 DIAGNOSIS — M5417 Radiculopathy, lumbosacral region: Secondary | ICD-10-CM | POA: Insufficient documentation

## 2015-06-27 DIAGNOSIS — M5416 Radiculopathy, lumbar region: Secondary | ICD-10-CM

## 2015-06-27 DIAGNOSIS — M545 Low back pain: Secondary | ICD-10-CM | POA: Diagnosis not present

## 2015-06-29 DIAGNOSIS — M25551 Pain in right hip: Secondary | ICD-10-CM | POA: Diagnosis not present

## 2015-06-29 DIAGNOSIS — M545 Low back pain: Secondary | ICD-10-CM | POA: Diagnosis not present

## 2015-06-29 DIAGNOSIS — R262 Difficulty in walking, not elsewhere classified: Secondary | ICD-10-CM | POA: Diagnosis not present

## 2015-06-29 DIAGNOSIS — M6281 Muscle weakness (generalized): Secondary | ICD-10-CM | POA: Diagnosis not present

## 2015-06-30 ENCOUNTER — Telehealth: Payer: Self-pay | Admitting: Family Medicine

## 2015-06-30 NOTE — Telephone Encounter (Signed)
Patient stated that she received a message on yesterday to call our office.  Please call patient.

## 2015-07-03 ENCOUNTER — Other Ambulatory Visit: Payer: Self-pay | Admitting: Family Medicine

## 2015-07-03 ENCOUNTER — Other Ambulatory Visit: Payer: Self-pay

## 2015-07-03 DIAGNOSIS — M549 Dorsalgia, unspecified: Secondary | ICD-10-CM

## 2015-07-03 DIAGNOSIS — R262 Difficulty in walking, not elsewhere classified: Secondary | ICD-10-CM | POA: Diagnosis not present

## 2015-07-03 DIAGNOSIS — Z820 Family history of epilepsy and other diseases of the nervous system: Secondary | ICD-10-CM

## 2015-07-03 DIAGNOSIS — M6281 Muscle weakness (generalized): Secondary | ICD-10-CM | POA: Diagnosis not present

## 2015-07-03 DIAGNOSIS — M25551 Pain in right hip: Secondary | ICD-10-CM | POA: Diagnosis not present

## 2015-07-03 DIAGNOSIS — M545 Low back pain: Secondary | ICD-10-CM | POA: Diagnosis not present

## 2015-07-03 DIAGNOSIS — G8929 Other chronic pain: Secondary | ICD-10-CM

## 2015-07-03 NOTE — Progress Notes (Signed)
Sorry she does not have a history of MS, we will send her to Dr. Yves Dill first, and if needed neurologist

## 2015-07-03 NOTE — Progress Notes (Signed)
Faxed notes to both offices for referrals.

## 2015-07-05 DIAGNOSIS — M25551 Pain in right hip: Secondary | ICD-10-CM | POA: Diagnosis not present

## 2015-07-05 DIAGNOSIS — M545 Low back pain: Secondary | ICD-10-CM | POA: Diagnosis not present

## 2015-07-05 DIAGNOSIS — R262 Difficulty in walking, not elsewhere classified: Secondary | ICD-10-CM | POA: Diagnosis not present

## 2015-07-05 DIAGNOSIS — M6281 Muscle weakness (generalized): Secondary | ICD-10-CM | POA: Diagnosis not present

## 2015-07-13 DIAGNOSIS — M545 Low back pain: Secondary | ICD-10-CM | POA: Diagnosis not present

## 2015-07-13 DIAGNOSIS — R262 Difficulty in walking, not elsewhere classified: Secondary | ICD-10-CM | POA: Diagnosis not present

## 2015-07-13 DIAGNOSIS — M6281 Muscle weakness (generalized): Secondary | ICD-10-CM | POA: Diagnosis not present

## 2015-07-13 DIAGNOSIS — M25551 Pain in right hip: Secondary | ICD-10-CM | POA: Diagnosis not present

## 2015-07-17 ENCOUNTER — Encounter: Payer: BLUE CROSS/BLUE SHIELD | Admitting: Family Medicine

## 2015-07-17 DIAGNOSIS — M25551 Pain in right hip: Secondary | ICD-10-CM | POA: Diagnosis not present

## 2015-07-17 DIAGNOSIS — M6281 Muscle weakness (generalized): Secondary | ICD-10-CM | POA: Diagnosis not present

## 2015-07-17 DIAGNOSIS — R262 Difficulty in walking, not elsewhere classified: Secondary | ICD-10-CM | POA: Diagnosis not present

## 2015-07-17 DIAGNOSIS — M545 Low back pain: Secondary | ICD-10-CM | POA: Diagnosis not present

## 2015-08-29 ENCOUNTER — Other Ambulatory Visit: Payer: Self-pay | Admitting: Family Medicine

## 2015-08-29 DIAGNOSIS — I10 Essential (primary) hypertension: Secondary | ICD-10-CM

## 2015-08-29 NOTE — Telephone Encounter (Signed)
Lvm to inform patient prescription has been sent to pharmacy, and to schedule appt

## 2015-09-30 ENCOUNTER — Other Ambulatory Visit: Payer: Self-pay | Admitting: Family Medicine

## 2015-09-30 DIAGNOSIS — I10 Essential (primary) hypertension: Secondary | ICD-10-CM

## 2015-10-02 NOTE — Telephone Encounter (Signed)
Left voice mail informing patient prescription has been sent to pharmacy and for her to call to schedule appt

## 2015-10-09 DIAGNOSIS — Z3009 Encounter for other general counseling and advice on contraception: Secondary | ICD-10-CM | POA: Diagnosis not present

## 2015-10-09 DIAGNOSIS — N921 Excessive and frequent menstruation with irregular cycle: Secondary | ICD-10-CM | POA: Diagnosis not present

## 2015-10-09 DIAGNOSIS — N926 Irregular menstruation, unspecified: Secondary | ICD-10-CM | POA: Diagnosis not present

## 2015-10-28 ENCOUNTER — Other Ambulatory Visit: Payer: Self-pay | Admitting: Family Medicine

## 2015-10-28 DIAGNOSIS — I10 Essential (primary) hypertension: Secondary | ICD-10-CM

## 2015-11-01 ENCOUNTER — Ambulatory Visit: Payer: BLUE CROSS/BLUE SHIELD | Admitting: Family Medicine

## 2015-11-15 ENCOUNTER — Ambulatory Visit (INDEPENDENT_AMBULATORY_CARE_PROVIDER_SITE_OTHER): Payer: BLUE CROSS/BLUE SHIELD | Admitting: Family Medicine

## 2015-11-15 ENCOUNTER — Encounter: Payer: Self-pay | Admitting: Family Medicine

## 2015-11-15 VITALS — BP 124/92 | HR 77 | Temp 98.1°F | Resp 16 | Ht 62.0 in | Wt 254.0 lb

## 2015-11-15 DIAGNOSIS — I10 Essential (primary) hypertension: Secondary | ICD-10-CM

## 2015-11-15 DIAGNOSIS — F5101 Primary insomnia: Secondary | ICD-10-CM

## 2015-11-15 DIAGNOSIS — E559 Vitamin D deficiency, unspecified: Secondary | ICD-10-CM | POA: Diagnosis not present

## 2015-11-15 DIAGNOSIS — Z23 Encounter for immunization: Secondary | ICD-10-CM | POA: Diagnosis not present

## 2015-11-15 DIAGNOSIS — E785 Hyperlipidemia, unspecified: Secondary | ICD-10-CM

## 2015-11-15 DIAGNOSIS — F325 Major depressive disorder, single episode, in full remission: Secondary | ICD-10-CM

## 2015-11-15 DIAGNOSIS — Z114 Encounter for screening for human immunodeficiency virus [HIV]: Secondary | ICD-10-CM

## 2015-11-15 DIAGNOSIS — Z131 Encounter for screening for diabetes mellitus: Secondary | ICD-10-CM

## 2015-11-15 LAB — LIPID PANEL
Cholesterol: 206 mg/dL — ABNORMAL HIGH (ref <200)
HDL: 46 mg/dL — ABNORMAL LOW (ref >50)
LDL Cholesterol: 142 mg/dL — ABNORMAL HIGH
Total CHOL/HDL Ratio: 4.5 Ratio (ref <5.0)
Triglycerides: 88 mg/dL (ref <150)
VLDL: 18 mg/dL (ref <30)

## 2015-11-15 LAB — CBC WITH DIFFERENTIAL/PLATELET
Basophils Absolute: 37 cells/uL (ref 0–200)
Basophils Relative: 1 %
Eosinophils Absolute: 37 cells/uL (ref 15–500)
Eosinophils Relative: 1 %
HCT: 40.9 % (ref 35.0–45.0)
Hemoglobin: 13.9 g/dL (ref 11.7–15.5)
Lymphocytes Relative: 32 %
Lymphs Abs: 1184 cells/uL (ref 850–3900)
MCH: 29 pg (ref 27.0–33.0)
MCHC: 34 g/dL (ref 32.0–36.0)
MCV: 85.2 fL (ref 80.0–100.0)
MPV: 9.5 fL (ref 7.5–12.5)
Monocytes Absolute: 444 cells/uL (ref 200–950)
Monocytes Relative: 12 %
Neutro Abs: 1998 cells/uL (ref 1500–7800)
Neutrophils Relative %: 54 %
Platelets: 323 10*3/uL (ref 140–400)
RBC: 4.8 MIL/uL (ref 3.80–5.10)
RDW: 14.6 % (ref 11.0–15.0)
WBC: 3.7 10*3/uL — ABNORMAL LOW (ref 3.8–10.8)

## 2015-11-15 LAB — COMPLETE METABOLIC PANEL WITH GFR
ALT: 30 U/L — ABNORMAL HIGH (ref 6–29)
AST: 24 U/L (ref 10–35)
Albumin: 4.1 g/dL (ref 3.6–5.1)
Alkaline Phosphatase: 106 U/L (ref 33–115)
BUN: 7 mg/dL (ref 7–25)
CO2: 24 mmol/L (ref 20–31)
Calcium: 9.7 mg/dL (ref 8.6–10.2)
Chloride: 101 mmol/L (ref 98–110)
Creat: 0.91 mg/dL (ref 0.50–1.10)
GFR, Est African American: 86 mL/min (ref >=60)
GFR, Est Non African American: 74 mL/min (ref >=60)
Glucose, Bld: 78 mg/dL (ref 65–99)
Potassium: 3.5 mmol/L (ref 3.5–5.3)
Sodium: 141 mmol/L (ref 135–146)
Total Bilirubin: 1.2 mg/dL (ref 0.2–1.2)
Total Protein: 7.5 g/dL (ref 6.1–8.1)

## 2015-11-15 LAB — HEMOGLOBIN A1C
Hgb A1c MFr Bld: 5.7 % — ABNORMAL HIGH (ref <5.7)
Mean Plasma Glucose: 117 mg/dL

## 2015-11-15 LAB — TSH: TSH: 1.72 mIU/L

## 2015-11-15 MED ORDER — TELMISARTAN-HCTZ 80-25 MG PO TABS: 1.0000 | ORAL_TABLET | Freq: Every day | ORAL | 1 refills | Status: DC

## 2015-11-15 MED ORDER — AMLODIPINE BESYLATE 5 MG PO TABS: 5.0000 mg | ORAL_TABLET | Freq: Every day | ORAL | 1 refills | Status: DC

## 2015-11-15 MED ORDER — TEMAZEPAM 15 MG PO CAPS: 15.0000 mg | ORAL_CAPSULE | Freq: Every day | ORAL | 0 refills | Status: DC

## 2015-11-15 NOTE — Progress Notes (Signed)
Name: Deanna Perkins   MRN: [id number]    DOB: 10-25-1965   Date:11/15/2015       Progress Note  Subjective  Chief Complaint  Chief Complaint  Patient presents with  . Medication Refill    6 month F/U  . Hypertension  . Insomnia    Medication has been controlling symptoms, except waking up occasionally throughout the night.  . Depression    Stopped taking medication due to it making her sick on her stomach and irrating her stomach to the point-giving her diarrhea    HPI  HTN: taking medication daily and denies side effects of medication. No chest pain or palpitation. No SOB. DBP is elevated , discussed sleep study, but she states she does not snore and does not feel tired, we will recheck before she leaves.  Insomnia: taking medication prn and it works well for her. No side effects of medication. She has been sleeping at least 7 hours with medication. Taking medication prn   Lumbar spine Radiculitis: she has a long history of low back pain, since 2012, however since 2015 she has noticed right lower extremity radiculitis with  symptoms of burning , aching going down her lateral thigh and foot is getting worse. She states Dr. Katrinka Blazing ordered back X-rays that showed OA. She tried prednisone without help.  She finished PT and was supposed to see Dr. Council Mechanic but unable to schedule appointment   Major Depression Mild: she had  been off Citalopram for over 3 years, she is back on Citalopram since Feb because of increase in stress, raising teenage daughter, and had lost her parents, but she has been off medications ( it was causing diarrhea) , she is feeling well at this time.   Obesity: she has lost 6 lbs since last visit,  she has been eating more fruit and vegetables, cutting down on eating out and feels good  Patient Active Problem List   Diagnosis Date Noted  . Obesity, Class III, BMI 40-49.9 (morbid obesity) (HCC) 09/20/2014  . Back pain, chronic 09/17/2014  . Benign essential HTN  09/17/2014  . History of cataract surgery 09/17/2014  . Dyslipidemia 09/17/2014  . Iridocyclitis 09/17/2014  . Osteoarthritis of both knees 09/17/2014  . History of menorrhagia 09/17/2014  . Insomnia 09/17/2014  . Vitamin D deficiency 09/17/2014    Past Surgical History:  Procedure Laterality Date  . BREAST SURGERY     reduction  . CERVICAL CERCLAGE    . DILATION AND CURETTAGE OF UTERUS  2008  . EYE SURGERY Bilateral    cataracts  . foot suregry      Family History  Problem Relation Age of Onset  . Diabetes Mother   . Hypertension Mother   . Stroke Mother   . Cancer Father     lung  . Hypertension Father   . Cancer Maternal Aunt     breast    Social History   Social History  . Marital status: Single    Spouse name: N/A  . Number of children: N/A  . Years of education: N/A   Occupational History  . Not on file.   Social History Main Topics  . Smoking status: Never Smoker  . Smokeless tobacco: Never Used  . Alcohol use 0.0 oz/week     Comment: rarely  . Drug use: No  . Sexual activity: Not Currently    Birth control/ protection: IUD   Other Topics Concern  . Not on file   Social History  Narrative  . No narrative on file     Current Outpatient Prescriptions:  .  amLODipine (NORVASC) 5 MG tablet, Take 1 tablet (5 mg total) by mouth daily. for blood pressure, Disp: 90 tablet, Rfl: 1 .  levonorgestrel (MIRENA, 52 MG,) 20 MCG/24HR IUD, by Intrauterine route., Disp: , Rfl:  .  telmisartan-hydrochlorothiazide (MICARDIS HCT) 80-25 MG tablet, Take 1 tablet by mouth daily., Disp: 90 tablet, Rfl: 1 .  temazepam (RESTORIL) 15 MG capsule, Take 1 capsule (15 mg total) by mouth at bedtime., Disp: 90 capsule, Rfl: 0  Allergies  Allergen Reactions  . Chocolate Hives     ROS  Constitutional: Negative for fever , positive for weight change.  Respiratory: Negative for cough and shortness of breath.   Cardiovascular: Negative for chest pain or palpitations.   Gastrointestinal: Negative for abdominal pain, no bowel changes.  Musculoskeletal: Negative for gait problem or joint swelling.  Skin: Negative for rash.  Neurological: Negative for dizziness or headache.  No other specific complaints in a complete review of systems (except as listed in HPI above). Objective  Vitals:   11/15/15 0950  BP: (!) 126/92  Pulse: 77  Resp: 16  Temp: 98.1 F (36.7 C)  TempSrc: Oral  SpO2: 98%  Weight: 254 lb (115.2 kg)  Height: 5\' 2"  (1.575 m)    Body mass index is 46.46 kg/m.  Physical Exam  Constitutional: Patient appears well-developed and well-nourished. Obese  No distress.  HEENT: head atraumatic, normocephalic, pupils equal and reactive to light,  neck supple, throat within normal limits Cardiovascular: Normal rate, regular rhythm and normal heart sounds.  No murmur heard. No BLE edema. Pulmonary/Chest: Effort normal and breath sounds normal. No respiratory distress. Abdominal: Soft.  There is no tenderness. Psychiatric: Patient has a normal mood and affect. behavior is normal. Judgment and thought content normal.  PHQ2/9: Depression screen [facility] 2/9 11/15/2015 05/24/2015 03/01/2015 09/20/2014  Decreased Interest 0 0 0 0  Down, Depressed, Hopeless 0 0 0 0  PHQ - 2 Score 0 0 0 0    Fall Risk: Fall Risk  11/15/2015 05/24/2015 03/01/2015 09/20/2014  Falls in the past year? Yes No Yes Yes  Number falls in past yr: 2 or more - 2 or more 2 or more  Injury with Fall? Yes - Yes Yes  Follow up - - Falls evaluation completed;Falls prevention discussed -     Functional Status Survey: Is the patient deaf or have difficulty hearing?: No Does the patient have difficulty seeing, even when wearing glasses/contacts?: No Does the patient have difficulty concentrating, remembering, or making decisions?: No Does the patient have difficulty walking or climbing stairs?: No Does the patient have difficulty dressing or bathing?: No Does the patient have  difficulty doing errands alone such as visiting a doctor's office or shopping?: No    Assessment & Plan  1. Benign essential HTN  - TSH - CBC with Differential/Platelet - COMPLETE METABOLIC PANEL WITH GFR - telmisartan-hydrochlorothiazide (MICARDIS HCT) 80-25 MG tablet; Take 1 tablet by mouth daily.  Dispense: 90 tablet; Refill: 1 - amLODipine (NORVASC) 5 MG tablet; Take 1 tablet (5 mg total) by mouth daily. for blood pressure  Dispense: 90 tablet; Refill: 1  2. Dyslipidemia  - Lipid panel  3. Screening for HIV (human immunodeficiency virus)  -HIV  4. Diabetes mellitus screening  - Hemoglobin A1c  5. Vitamin D deficiency  - VITAMIN D 25 Hydroxy (Vit-D Deficiency, Fractures)  6. Obesity, Class III, BMI 40-49.9 (morbid obesity) (HCC)  Discussed with the patient the risk posed by an increased BMI. Discussed importance of portion control, calorie counting and at least 150 minutes of physical activity weekly. Avoid sweet beverages and drink more water. Eat at least 6 servings of fruit and vegetables daily   7. Depression, major, in remission  (HCC)  She has been off medication and is doing well, back to normal self, Celexa caused diarrhea, we will hold off on resuming medication at this time  8. Needs flu shot  refused  9. Primary insomnia  - temazepam (RESTORIL) 15 MG capsule; Take 1 capsule (15 mg total) by mouth at bedtime.  Dispense: 90 capsule; Refill: 0

## 2015-11-16 LAB — HIV ANTIBODY (ROUTINE TESTING W REFLEX): HIV 1&2 Ab, 4th Generation: NONREACTIVE

## 2015-11-16 LAB — VITAMIN D 25 HYDROXY (VIT D DEFICIENCY, FRACTURES): Vit D, 25-Hydroxy: 7 ng/mL — ABNORMAL LOW (ref 30–100)

## 2015-11-19 ENCOUNTER — Encounter: Payer: Self-pay | Admitting: Family Medicine

## 2015-11-19 DIAGNOSIS — R7303 Prediabetes: Secondary | ICD-10-CM | POA: Insufficient documentation

## 2015-12-05 DIAGNOSIS — M545 Low back pain: Secondary | ICD-10-CM | POA: Diagnosis not present

## 2015-12-05 DIAGNOSIS — M79604 Pain in right leg: Secondary | ICD-10-CM | POA: Diagnosis not present

## 2015-12-14 DIAGNOSIS — M7061 Trochanteric bursitis, right hip: Secondary | ICD-10-CM | POA: Diagnosis not present

## 2016-03-15 ENCOUNTER — Telehealth: Payer: Self-pay | Admitting: Family Medicine

## 2016-03-15 DIAGNOSIS — I10 Essential (primary) hypertension: Secondary | ICD-10-CM

## 2016-03-15 NOTE — Telephone Encounter (Signed)
Pt has scheduled appointment for 04-02-16 is it possible to send the 30 day supply to [facility]

## 2016-03-15 NOTE — Telephone Encounter (Signed)
Requesting a refill on telmisartan. She had a prescription for it but lost it the entire 90day supply. Please send to [facility] [phone]

## 2016-03-15 NOTE — Telephone Encounter (Signed)
Left voice message informing pt to schedule appt °

## 2016-03-17 ENCOUNTER — Other Ambulatory Visit: Payer: Self-pay | Admitting: Family Medicine

## 2016-03-17 DIAGNOSIS — I10 Essential (primary) hypertension: Secondary | ICD-10-CM

## 2016-03-17 MED ORDER — TELMISARTAN-HCTZ 80-25 MG PO TABS: 1.0000 | ORAL_TABLET | Freq: Every day | ORAL | 0 refills | Status: DC

## 2016-04-02 ENCOUNTER — Ambulatory Visit: Payer: BLUE CROSS/BLUE SHIELD | Admitting: Family Medicine

## 2016-04-02 ENCOUNTER — Other Ambulatory Visit: Payer: Self-pay | Admitting: Family Medicine

## 2016-04-02 ENCOUNTER — Telehealth: Payer: Self-pay | Admitting: Family Medicine

## 2016-04-02 DIAGNOSIS — I10 Essential (primary) hypertension: Secondary | ICD-10-CM

## 2016-04-02 DIAGNOSIS — F5101 Primary insomnia: Secondary | ICD-10-CM

## 2016-04-02 MED ORDER — AMLODIPINE BESYLATE 5 MG PO TABS: 5.0000 mg | ORAL_TABLET | Freq: Every day | ORAL | 0 refills | Status: DC

## 2016-04-02 MED ORDER — TELMISARTAN-HCTZ 80-25 MG PO TABS: 1.0000 | ORAL_TABLET | Freq: Every day | ORAL | 0 refills | Status: DC

## 2016-04-02 MED ORDER — TEMAZEPAM 15 MG PO CAPS: 15.0000 mg | ORAL_CAPSULE | Freq: Every day | ORAL | 0 refills | Status: DC

## 2016-04-02 NOTE — Telephone Encounter (Signed)
I am sending 30 days supply, please get her a first appointment of morning or pm. Thank you

## 2016-04-02 NOTE — Telephone Encounter (Signed)
Notified patient and made appt for the end of April and explained she needs to keep appt or if needs to resch call way ahead.

## 2016-04-02 NOTE — Telephone Encounter (Signed)
PT HAD AN APPT TODAY AT 10:40 BUT WHEN WE TOLD THE PATIENT THAT YOU WERE AT LEAST 1 HR RUNNING BEHIND SHE SAID THAT SHE COULD NOT BE LATE FOR SHE ONLY TOOK A 2 HR BLOCK OFF FROM WORK. SHE IS ASKING IF YOU COULD JUST REFILL HER MEDICATION AND SHE HAS RESCHEDULED HER APPT FOR THE NEXT FEW WEEKS. PHARM IS [facility] ON [address]

## 2016-04-02 NOTE — Telephone Encounter (Signed)
PT ASKED IF YOU COULD GIVE HER A 6 M SUPPLY OF HER MEDICATION AND I TOLD HER THAT I WOULD PUT IT IN THE MESSAGE TO THE DR BUT COULD NOT GUARANTEE ANYTHING DUE TO DR HAS TO HAVE DOCUMENTATION AND THE MOST I HAVE SEEN HAS BEEN A 90 DAY SUPPLY BUT WOULD HAVE TO WAIT AND SEE WHAT THE DR APPROVES.

## 2016-05-01 ENCOUNTER — Other Ambulatory Visit: Payer: Self-pay | Admitting: Family Medicine

## 2016-05-01 DIAGNOSIS — I10 Essential (primary) hypertension: Secondary | ICD-10-CM

## 2016-05-06 ENCOUNTER — Ambulatory Visit (INDEPENDENT_AMBULATORY_CARE_PROVIDER_SITE_OTHER): Payer: BLUE CROSS/BLUE SHIELD | Admitting: Family Medicine

## 2016-05-06 ENCOUNTER — Encounter: Payer: Self-pay | Admitting: Family Medicine

## 2016-05-06 VITALS — BP 110/62 | HR 82 | Temp 97.8°F | Resp 16 | Ht 62.0 in | Wt 257.1 lb

## 2016-05-06 DIAGNOSIS — R7303 Prediabetes: Secondary | ICD-10-CM | POA: Diagnosis not present

## 2016-05-06 DIAGNOSIS — D708 Other neutropenia: Secondary | ICD-10-CM | POA: Diagnosis not present

## 2016-05-06 DIAGNOSIS — E785 Hyperlipidemia, unspecified: Secondary | ICD-10-CM

## 2016-05-06 DIAGNOSIS — I10 Essential (primary) hypertension: Secondary | ICD-10-CM | POA: Diagnosis not present

## 2016-05-06 DIAGNOSIS — F5101 Primary insomnia: Secondary | ICD-10-CM | POA: Diagnosis not present

## 2016-05-06 DIAGNOSIS — F325 Major depressive disorder, single episode, in full remission: Secondary | ICD-10-CM | POA: Diagnosis not present

## 2016-05-06 DIAGNOSIS — E559 Vitamin D deficiency, unspecified: Secondary | ICD-10-CM

## 2016-05-06 MED ORDER — TEMAZEPAM 15 MG PO CAPS: 15.0000 mg | ORAL_CAPSULE | Freq: Every day | ORAL | 0 refills | Status: DC

## 2016-05-06 MED ORDER — AMLODIPINE BESYLATE 5 MG PO TABS: 5.0000 mg | ORAL_TABLET | Freq: Every day | ORAL | 1 refills | Status: DC

## 2016-05-06 MED ORDER — TELMISARTAN-HCTZ 80-25 MG PO TABS: 1.0000 | ORAL_TABLET | Freq: Every day | ORAL | 1 refills | Status: DC

## 2016-05-06 MED ORDER — VITAMIN D (ERGOCALCIFEROL) 1.25 MG (50000 UNIT) PO CAPS: 50000.0000 [IU] | ORAL_CAPSULE | ORAL | 0 refills | Status: DC

## 2016-05-06 NOTE — Progress Notes (Signed)
Name: Deanna Perkins   MRN: [id number]    DOB: 04-12-1965   Date:05/06/2016       Progress Note  Subjective  Chief Complaint  Chief Complaint  Patient presents with  . Medication Refill    6 month F/U  . Hypertension    Edema in bilateral feet   . Insomnia    Has ran out of medication and was taking otc Sleep Aid. Sleeping 6-7 hours nightly   . Hip Pain    Has injection in her hip-Orthopedic and felt better but the pain is back now. Worst when sitting for long periods of time and then she tries to get up and walk.    HPI  Right hip pain: seen by neurosurgeon at [facility] and advised to see Ortho, she went to  [facility], she had a steroid injection and pain improved temporarily, but it is getting worse again. Aggravated by increase in activity ( walking or standing too long) or by sitting too long. She has to shift positions. Feels stiff when she first starts to walk , not taking any pills. Discussed Tylenol otc and to go back to him. We will get notes from his office. She had PT last year without help. Advised to go back to [facility]  HTN: taking medication daily and denies side effects of medication. No chest pain or palpitation. No SOB.   Insomnia: taking medication prn and it works well for her. No side effects of medication. She has been sleeping at least 7 hours with medication. Taking medication prn   Major Depression Mild: she had been off Citalopram for over 3 years, she is back went back on  Citalopram Feb 2017  because of increase in stress, raising teenage daughter, and had lost her parents, but she has been off medications again and is doing well, coping better with stress.   Obesity: she has gained 3 lbs since last visit, she is still drinking regular sodas 16 ounces daily, she has not been skipping meals, but eats fast food at least 7 days a week. Discussed life style modification  Pre-diabetes: hgbA1C 5.7%, she denies polyphagia, polydipsia or polyuria.  Family history of DM.   Dyslipidemia: she will resume diet and we will recheck next visit, and if no improvement we will consider medication   Patient Active Problem List   Diagnosis Date Noted  . Pre-diabetes 11/19/2015  . Obesity, Class III, BMI 40-49.9 (morbid obesity) (HCC) 09/20/2014  . Back pain, chronic 09/17/2014  . Benign essential HTN 09/17/2014  . History of cataract surgery 09/17/2014  . Dyslipidemia 09/17/2014  . Iridocyclitis 09/17/2014  . Osteoarthritis of both knees 09/17/2014  . History of menorrhagia 09/17/2014  . Insomnia 09/17/2014  . Vitamin D deficiency 09/17/2014    Past Surgical History:  Procedure Laterality Date  . BREAST SURGERY     reduction  . CERVICAL CERCLAGE    . DILATION AND CURETTAGE OF UTERUS  2008  . EYE SURGERY Bilateral    cataracts  . foot suregry      Family History  Problem Relation Age of Onset  . Diabetes Mother   . Hypertension Mother   . Stroke Mother   . Cancer Father     lung  . Hypertension Father   . Cancer Maternal Aunt     breast    Social History   Social History  . Marital status: Single    Spouse name: N/A  . Number of children: N/A  .  Years of education: N/A   Occupational History  . Not on file.   Social History Main Topics  . Smoking status: Never Smoker  . Smokeless tobacco: Never Used  . Alcohol use 0.0 oz/week     Comment: rarely  . Drug use: No  . Sexual activity: Not Currently    Birth control/ protection: IUD   Other Topics Concern  . Not on file   Social History Narrative  . No narrative on file     Current Outpatient Prescriptions:  .  amLODipine (NORVASC) 5 MG tablet, Take 1 tablet (5 mg total) by mouth daily. for blood pressure, Disp: 90 tablet, Rfl: 1 .  levonorgestrel (MIRENA, 52 MG,) 20 MCG/24HR IUD, by Intrauterine route., Disp: , Rfl:  .  telmisartan-hydrochlorothiazide (MICARDIS HCT) 80-25 MG tablet, Take 1 tablet by mouth daily., Disp: 90 tablet, Rfl: 1 .  temazepam  (RESTORIL) 15 MG capsule, Take 1 capsule (15 mg total) by mouth at bedtime., Disp: 90 capsule, Rfl: 0 .  Vitamin D, Ergocalciferol, (DRISDOL) 50000 units CAPS capsule, Take 1 capsule (50,000 Units total) by mouth every 7 (seven) days., Disp: 12 capsule, Rfl: 0  Allergies  Allergen Reactions  . Chocolate Hives     ROS  Constitutional: Negative for fever or weight change.  Respiratory: Negative for cough and shortness of breath.   Cardiovascular: Negative for chest pain or palpitations.  Gastrointestinal: Negative for abdominal pain, no bowel changes.  Musculoskeletal: Positive  for gait problem no  joint swelling.  Skin: Negative for rash.  Neurological: Negative for dizziness or headache.  No other specific complaints in a complete review of systems (except as listed in HPI above).  Objective  Vitals:   05/06/16 1551  BP: 110/62  Pulse: 82  Resp: 16  Temp: 97.8 F (36.6 C)  TempSrc: Oral  SpO2: 98%  Weight: 257 lb 1.6 oz (116.6 kg)  Height: 5\' 2"  (1.575 m)    Body mass index is 47.02 kg/m.  Physical Exam  Constitutional: Patient appears well-developed and well-nourished. Obese  No distress.  HEENT: head atraumatic, normocephalic, pupils equal and reactive to light, neck supple, throat within normal limits Cardiovascular: Normal rate, regular rhythm and normal heart sounds.  No murmur heard. No BLE edema. Pulmonary/Chest: Effort normal and breath sounds normal. No respiratory distress. Abdominal: Soft.  There is no tenderness. Psychiatric: Patient has a normal mood and affect. behavior is normal. Judgment and thought content normal. Muscular Skeletal: seen by [facility], negative straight leg raise, pain during palpation of right lower back ( sacroiliac joint) pain with pain with external rotation of right hip.   PHQ2/9: Depression screen [facility] 2/9 05/06/2016 11/15/2015 05/24/2015 03/01/2015 09/20/2014  Decreased Interest 0 0 0 0 0  Down, Depressed, Hopeless 0 0 0 0 0   PHQ - 2 Score 0 0 0 0 0     Fall Risk: Fall Risk  05/06/2016 11/15/2015 05/24/2015 03/01/2015 09/20/2014  Falls in the past year? No Yes No Yes Yes  Number falls in past yr: - 2 or more - 2 or more 2 or more  Injury with Fall? - Yes - Yes Yes  Follow up - - - Falls evaluation completed;Falls prevention discussed -     Functional Status Survey: Is the patient deaf or have difficulty hearing?: No Does the patient have difficulty seeing, even when wearing glasses/contacts?: No Does the patient have difficulty concentrating, remembering, or making decisions?: No Does the patient have difficulty walking or climbing stairs?: No  Does the patient have difficulty dressing or bathing?: No Does the patient have difficulty doing errands alone such as visiting a doctor's office or shopping?: No   Assessment & Plan  1. Benign essential HTN  - telmisartan-hydrochlorothiazide (MICARDIS HCT) 80-25 MG tablet; Take 1 tablet by mouth daily.  Dispense: 90 tablet; Refill: 1 - amLODipine (NORVASC) 5 MG tablet; Take 1 tablet (5 mg total) by mouth daily. for blood pressure  Dispense: 90 tablet; Refill: 1  2. Dyslipidemia  LDL is higher and HDL is lower, discussed life style modifications Lipid panel shows low HDL : to improve HDL patient  needs to eat tree nuts ( pecans/pistachios/almonds ) four times weekly, eat fish two times weekly  and exercise  at least 150 minutes per week 3. Vitamin D deficiency  - Vitamin D, Ergocalciferol, (DRISDOL) 50000 units CAPS capsule; Take 1 capsule (50,000 Units total) by mouth every 7 (seven) days.  Dispense: 12 capsule; Refill: 0  4. Obesity, Class III, BMI 40-49.9 (morbid obesity) (HCC)  Discussed with the patient the risk posed by an increased BMI. Discussed importance of portion control, calorie counting and at least 150 minutes of physical activity weekly. Avoid sweet beverages and drink more water. Eat at least 6 servings of fruit and vegetables daily   5.  Depression, major, in remission (HCC)  In remission   6. Primary insomnia  Explained controlled, and needs follow up when about out of medication  - temazepam (RESTORIL) 15 MG capsule; Take 1 capsule (15 mg total) by mouth at bedtime.  Dispense: 90 capsule; Refill: 0  7. Pre-diabetes  Discussed importance of following diet.   8. Other neutropenia (HCC)  Recheck labs next visit

## 2016-05-06 NOTE — Patient Instructions (Signed)
Diabetes Mellitus and Food It is important for you to manage your blood sugar (glucose) level. Your blood glucose level can be greatly affected by what you eat. Eating healthier foods in the appropriate amounts throughout the day at about the same time each day will help you control your blood glucose level. It can also help slow or prevent worsening of your diabetes mellitus. Healthy eating may even help you improve the level of your blood pressure and reach or maintain a healthy weight. General recommendations for healthful eating and cooking habits include:  Eating meals and snacks regularly. Avoid going long periods of time without eating to lose weight.  Eating a diet that consists mainly of plant-based foods, such as fruits, vegetables, nuts, legumes, and whole grains.  Using low-heat cooking methods, such as baking, instead of high-heat cooking methods, such as deep frying.  Work with your dietitian to make sure you understand how to use the Nutrition Facts information on food labels. How can food affect me? Carbohydrates Carbohydrates affect your blood glucose level more than any other type of food. Your dietitian will help you determine how many carbohydrates to eat at each meal and teach you how to count carbohydrates. Counting carbohydrates is important to keep your blood glucose at a healthy level, especially if you are using insulin or taking certain medicines for diabetes mellitus. Alcohol Alcohol can cause sudden decreases in blood glucose (hypoglycemia), especially if you use insulin or take certain medicines for diabetes mellitus. Hypoglycemia can be a life-threatening condition. Symptoms of hypoglycemia (sleepiness, dizziness, and disorientation) are similar to symptoms of having too much alcohol. If your health care provider has given you approval to drink alcohol, do so in moderation and use the following guidelines:  Women should not have more than one drink per day, and men  should not have more than two drinks per day. One drink is equal to: ? 12 oz of beer. ? 5 oz of wine. ? 1 oz of hard liquor.  Do not drink on an empty stomach.  Keep yourself hydrated. Have water, diet soda, or unsweetened iced tea.  Regular soda, juice, and other mixers might contain a lot of carbohydrates and should be counted.  What foods are not recommended? As you make food choices, it is important to remember that all foods are not the same. Some foods have fewer nutrients per serving than other foods, even though they might have the same number of calories or carbohydrates. It is difficult to get your body what it needs when you eat foods with fewer nutrients. Examples of foods that you should avoid that are high in calories and carbohydrates but low in nutrients include:  Trans fats (most processed foods list trans fats on the Nutrition Facts label).  Regular soda.  Juice.  Candy.  Sweets, such as cake, pie, doughnuts, and cookies.  Fried foods.  What foods can I eat? Eat nutrient-rich foods, which will nourish your body and keep you healthy. The food you should eat also will depend on several factors, including:  The calories you need.  The medicines you take.  Your weight.  Your blood glucose level.  Your blood pressure level.  Your cholesterol level.  You should eat a variety of foods, including:  Protein. ? Lean cuts of meat. ? Proteins low in saturated fats, such as fish, egg whites, and beans. Avoid processed meats.  Fruits and vegetables. ? Fruits and vegetables that may help control blood glucose levels, such as apples,   mangoes, and yams.  Dairy products. ? Choose fat-free or low-fat dairy products, such as milk, yogurt, and cheese.  Grains, bread, pasta, and rice. ? Choose whole grain products, such as multigrain bread, whole oats, and brown rice. These foods may help control blood pressure.  Fats. ? Foods containing healthful fats, such as  nuts, avocado, olive oil, canola oil, and fish.  Does everyone with diabetes mellitus have the same meal plan? Because every person with diabetes mellitus is different, there is not one meal plan that works for everyone. It is very important that you meet with a dietitian who will help you create a meal plan that is just right for you. This information is not intended to replace advice given to you by your health care provider. Make sure you discuss any questions you have with your health care provider. Document Released: 09/20/2004 Document Revised: 06/01/2015 Document Reviewed: 11/20/2012 Elsevier Interactive Patient Education  2017 [employer] Inc.  

## 2016-06-14 DIAGNOSIS — H209 Unspecified iridocyclitis: Secondary | ICD-10-CM | POA: Diagnosis not present

## 2016-06-21 DIAGNOSIS — H209 Unspecified iridocyclitis: Secondary | ICD-10-CM | POA: Diagnosis not present

## 2016-08-03 ENCOUNTER — Other Ambulatory Visit: Payer: Self-pay | Admitting: Family Medicine

## 2016-08-03 DIAGNOSIS — E559 Vitamin D deficiency, unspecified: Secondary | ICD-10-CM

## 2016-08-22 ENCOUNTER — Other Ambulatory Visit: Payer: Self-pay | Admitting: Family Medicine

## 2016-08-22 DIAGNOSIS — I10 Essential (primary) hypertension: Secondary | ICD-10-CM

## 2016-08-22 NOTE — Telephone Encounter (Signed)
Needs follow up, , she should have one more month left of refill

## 2016-08-26 NOTE — Telephone Encounter (Signed)
Called pt and informed her of refill that was denied and pt states she told the pharmacy to recall that RX and she will call back to reschedule an appt

## 2016-10-31 DIAGNOSIS — M7061 Trochanteric bursitis, right hip: Secondary | ICD-10-CM | POA: Diagnosis not present

## 2016-11-11 ENCOUNTER — Encounter: Payer: Self-pay | Admitting: Family Medicine

## 2016-11-11 ENCOUNTER — Ambulatory Visit (INDEPENDENT_AMBULATORY_CARE_PROVIDER_SITE_OTHER): Payer: BLUE CROSS/BLUE SHIELD | Admitting: Family Medicine

## 2016-11-11 VITALS — BP 130/86 | HR 88 | Resp 14 | Ht 62.0 in | Wt 261.5 lb

## 2016-11-11 DIAGNOSIS — Z1239 Encounter for other screening for malignant neoplasm of breast: Secondary | ICD-10-CM

## 2016-11-11 DIAGNOSIS — Z1231 Encounter for screening mammogram for malignant neoplasm of breast: Secondary | ICD-10-CM

## 2016-11-11 DIAGNOSIS — I10 Essential (primary) hypertension: Secondary | ICD-10-CM

## 2016-11-11 DIAGNOSIS — E538 Deficiency of other specified B group vitamins: Secondary | ICD-10-CM | POA: Diagnosis not present

## 2016-11-11 DIAGNOSIS — Z30432 Encounter for removal of intrauterine contraceptive device: Secondary | ICD-10-CM | POA: Diagnosis not present

## 2016-11-11 DIAGNOSIS — E559 Vitamin D deficiency, unspecified: Secondary | ICD-10-CM | POA: Diagnosis not present

## 2016-11-11 DIAGNOSIS — F325 Major depressive disorder, single episode, in full remission: Secondary | ICD-10-CM

## 2016-11-11 DIAGNOSIS — D708 Other neutropenia: Secondary | ICD-10-CM

## 2016-11-11 DIAGNOSIS — F5101 Primary insomnia: Secondary | ICD-10-CM

## 2016-11-11 DIAGNOSIS — R7303 Prediabetes: Secondary | ICD-10-CM

## 2016-11-11 DIAGNOSIS — E785 Hyperlipidemia, unspecified: Secondary | ICD-10-CM | POA: Diagnosis not present

## 2016-11-11 LAB — POCT GLYCOSYLATED HEMOGLOBIN (HGB A1C): Hemoglobin A1C: 5.5

## 2016-11-11 MED ORDER — TEMAZEPAM 15 MG PO CAPS: 15.0000 mg | ORAL_CAPSULE | Freq: Every day | ORAL | 0 refills | Status: DC

## 2016-11-11 MED ORDER — AMLODIPINE BESYLATE 5 MG PO TABS: 5.0000 mg | ORAL_TABLET | Freq: Every day | ORAL | 1 refills | Status: DC

## 2016-11-11 MED ORDER — VITAMIN D (ERGOCALCIFEROL) 1.25 MG (50000 UNIT) PO CAPS: 50000.0000 [IU] | ORAL_CAPSULE | ORAL | 0 refills | Status: DC

## 2016-11-11 MED ORDER — TELMISARTAN-HCTZ 80-25 MG PO TABS
1.0000 | ORAL_TABLET | Freq: Every day | ORAL | 1 refills | Status: DC
Start: 2016-11-11 — End: 2017-05-09

## 2016-11-11 NOTE — Progress Notes (Signed)
Name: Deanna Perkins   MRN: [id number]    DOB: 11/04/1965   Date:11/11/2016       Progress Note  Subjective  Chief Complaint  Chief Complaint  Patient presents with  . Medication Refill  . Contraception    removal    HPI  Right hip pain: [facility] and advised to see Ortho, she went to  [facility], she had two injections, and is doing better now  HTN: taking medication daily and denies side effects of medication. No chest pain, palpitation or SOB.  Insomnia: taking medication prn and it works well for her. No side effects of medication. She has been sleeping at least 7 hours with medication. Taking otc medication, she never started on Temazepam, misplaced paper rx. We will send it again today   Major Depression Mild: she had been off Citalopram for over 3 years, she is back went back on  Citalopram Feb 2017  because of increase in stress, raising teenage daughter, and had lost her parents, but she has been off medications for months ago and is doing well.   Obesity: she has gained 3 lbs since last visit, she states not drinking sodas daily anymore, drinking more water, and is not sure why she gained weight, she started to walk again, she also is cooking more at home She is not interested in taking medications for weight loss  Pre-diabetes: hgbA1C 5.7%, she denies polyphagia, polydipsia or polyuria. Family history of DM.   Dyslipidemia: she is eating healthier, we will recheck labs.   Contraception: not sexually active, had IUD placed 5 years ago and would like to have it removed, she states she had it for heavy cycles, explained that if cycles are heavy once removed needs to have another IUD placed.    Patient Active Problem List   Diagnosis Date Noted  . Pre-diabetes 11/19/2015  . Obesity, Class III, BMI 40-49.9 (morbid obesity) (HCC) 09/20/2014  . Back pain, chronic 09/17/2014  . Benign essential HTN 09/17/2014  . History of cataract surgery  09/17/2014  . Dyslipidemia 09/17/2014  . Iridocyclitis 09/17/2014  . Osteoarthritis of both knees 09/17/2014  . History of menorrhagia 09/17/2014  . Insomnia 09/17/2014  . Vitamin D deficiency 09/17/2014    Past Surgical History:  Procedure Laterality Date  . BREAST SURGERY     reduction  . CERVICAL CERCLAGE    . DILATION AND CURETTAGE OF UTERUS  2008  . EYE SURGERY Bilateral    cataracts  . foot suregry      Family History  Problem Relation Age of Onset  . Diabetes Mother   . Hypertension Mother   . Stroke Mother   . Cancer Father        lung  . Hypertension Father   . Cancer Maternal Aunt        breast    Social History   Socioeconomic History  . Marital status: Single    Spouse name: Not on file  . Number of children: Not on file  . Years of education: Not on file  . Highest education level: Not on file  Social Needs  . Financial resource strain: Not on file  . Food insecurity - worry: Not on file  . Food insecurity - inability: Not on file  . Transportation needs - medical: Not on file  . Transportation needs - non-medical: Not on file  Occupational History  . Not on file  Tobacco Use  . Smoking status:  Never Smoker  . Smokeless tobacco: Never Used  Substance and Sexual Activity  . Alcohol use: Yes    Alcohol/week: 0.0 oz    Comment: rarely  . Drug use: No  . Sexual activity: Not Currently    Birth control/protection: IUD  Other Topics Concern  . Not on file  Social History Narrative  . Not on file     Current Outpatient Medications:  .  amLODipine (NORVASC) 5 MG tablet, Take 1 tablet (5 mg total) daily by mouth. for blood pressure, Disp: 90 tablet, Rfl: 1 .  telmisartan-hydrochlorothiazide (MICARDIS HCT) 80-25 MG tablet, Take 1 tablet daily by mouth., Disp: 90 tablet, Rfl: 1 .  temazepam (RESTORIL) 15 MG capsule, Take 1 capsule (15 mg total) at bedtime by mouth., Disp: 90 capsule, Rfl: 0 .  Vitamin D, Ergocalciferol, (DRISDOL) 50000 units CAPS  capsule, Take 1 capsule (50,000 Units total) every 7 (seven) days by mouth., Disp: 12 capsule, Rfl: 0  Allergies  Allergen Reactions  . Chocolate Hives     ROS  Constitutional: Negative for fever or weight change.  Respiratory: Negative for cough and shortness of breath.   Cardiovascular: Negative for chest pain or palpitations.  Gastrointestinal: Negative for abdominal pain, no bowel changes.  Musculoskeletal: Negative for gait problem or joint swelling.  Skin: Negative for rash.  Neurological: Negative for dizziness or headache.  No other specific complaints in a complete review of systems (except as listed in HPI above).   Objective  Vitals:   11/11/16 1516  BP: 130/86  Pulse: 88  Resp: 14  SpO2: 98%  Weight: 261 lb 8 oz (118.6 kg)  Height: 5\' 2"  (1.575 m)    Body mass index is 47.83 kg/m.  Physical Exam  Constitutional: Patient appears well-developed and well-nourished. Obese  No distress.  HEENT: head atraumatic, normocephalic, pupils equal and reactive to light,neck supple, throat within normal limits Cardiovascular: Normal rate, regular rhythm and normal heart sounds.  No murmur heard. No BLE edema. Pulmonary/Chest: Effort normal and breath sounds normal. No respiratory distress. Abdominal: Soft.  There is no tenderness. GYN: IUD strings visualized, verbal consent given, used betadine and IUD removed with ring forceps, no complications, patient tolerated procedure well Psychiatric: Patient has a normal mood and affect. behavior is normal. Judgment and thought content normal.  PHQ2/9: Depression screen [facility] 2/9 05/06/2016 11/15/2015 05/24/2015 03/01/2015 09/20/2014  Decreased Interest 0 0 0 0 0  Down, Depressed, Hopeless 0 0 0 0 0  PHQ - 2 Score 0 0 0 0 0     Fall Risk: Fall Risk  11/11/2016 05/06/2016 11/15/2015 05/24/2015 03/01/2015  Falls in the past year? Yes No Yes No Yes  Number falls in past yr: 1 - 2 or more - 2 or more  Injury with Fall? No - Yes - Yes   Comment - - Hurt her left knee in early October - -  Follow up Education provided - - - Falls evaluation completed;Falls prevention discussed      Assessment & Plan  1. Pre-diabetes  - POCT HgB A1C - Insulin, random  2. Vitamin D deficiency  - Vitamin D, Ergocalciferol, (DRISDOL) 50000 units CAPS capsule; Take 1 capsule (50,000 Units total) every 7 (seven) days by mouth.  Dispense: 12 capsule; Refill: 0 - VITAMIN D 25 Hydroxy (Vit-D Deficiency, Fractures)  3. Primary insomnia  - temazepam (RESTORIL) 15 MG capsule; Take 1 capsule (15 mg total) at bedtime by mouth.  Dispense: 90 capsule; Refill: 0  4. Benign  essential HTN  - telmisartan-hydrochlorothiazide (MICARDIS HCT) 80-25 MG tablet; Take 1 tablet daily by mouth.  Dispense: 90 tablet; Refill: 1 - amLODipine (NORVASC) 5 MG tablet; Take 1 tablet (5 mg total) daily by mouth. for blood pressure  Dispense: 90 tablet; Refill: 1 - CBC with Differential/Platelet - COMPLETE METABOLIC PANEL WITH GFR  5. Dyslipidemia  - Lipid panel  6. Obesity, Class III, BMI 40-49.9 (morbid obesity) (HCC)  Discussed with the patient the risk posed by an increased BMI. Discussed importance of portion control, calorie counting and at least 150 minutes of physical activity weekly. Avoid sweet beverages and drink more water. Eat at least 6 servings of fruit and vegetables daily   7. Other neutropenia (HCC)  -CBC  8. Depression, major, in remission (HCC)  Doing well   9. Breast cancer screening  - MM DIGITAL SCREENING BILATERAL; Future  10. B12 deficiency  - Vitamin B12  11. Encounter for IUD removal  Removed ( see exam)

## 2017-02-13 DIAGNOSIS — M21371 Foot drop, right foot: Secondary | ICD-10-CM | POA: Diagnosis not present

## 2017-02-13 DIAGNOSIS — M79671 Pain in right foot: Secondary | ICD-10-CM | POA: Diagnosis not present

## 2017-02-22 IMAGING — MR MR LUMBAR SPINE W/O CM
5 series · 38 of 48 positions shown · non-contrast
Comparison: None.

CLINICAL DATA: No known injury, low back pain

EXAM:
MRI LUMBAR SPINE WITHOUT CONTRAST
TECHNIQUE: Multiplanar, multisequence MR imaging of the lumbar spine was
performed. No intravenous contrast was administered.

[Series 2: T2 · sagittal · 4.0mm · 0.81mm/px · 6 of 15 slices shown (1 of 2)]
[im 1/15]
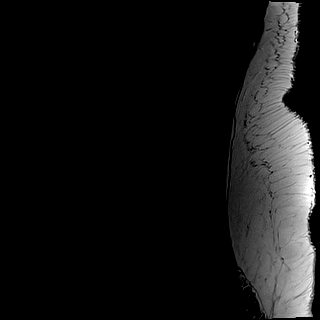
[im 3/15]
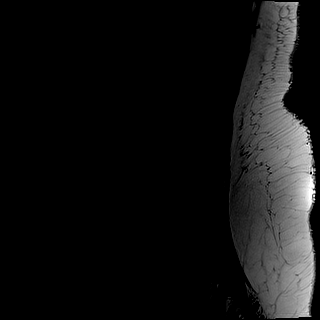
[im 6/15]
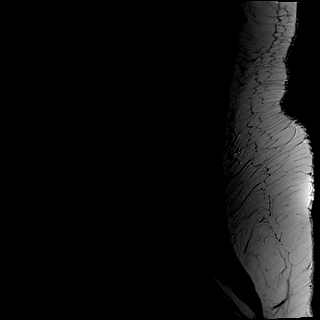
[im 9/15]
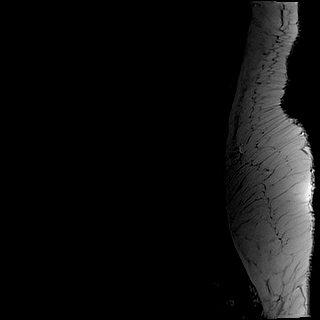
[im 12/15]
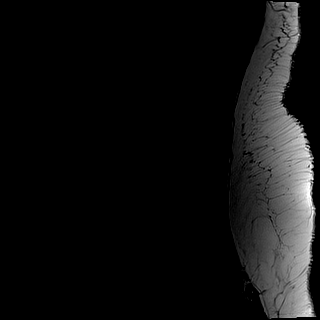
[im 15/15]
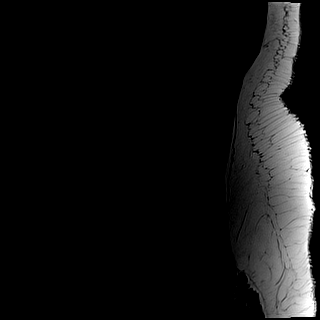

[Series 3: T1 · sagittal · 4.0mm · 0.81mm/px · 6 of 15 slices shown (1 of 2)]
[im 1/15]
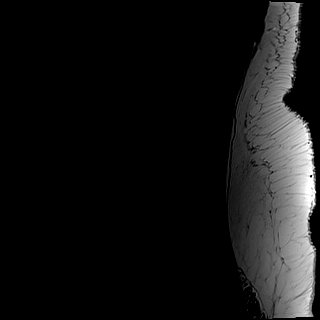
[im 3/15]
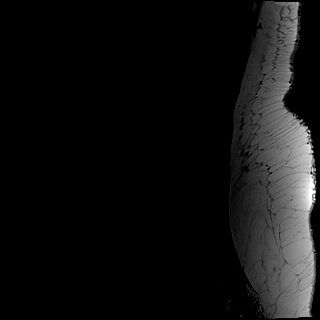
[im 6/15]
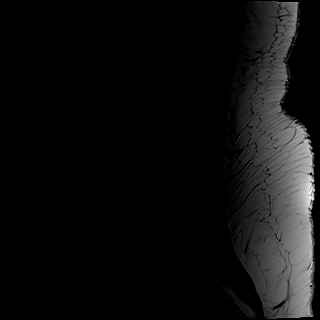
[im 9/15]
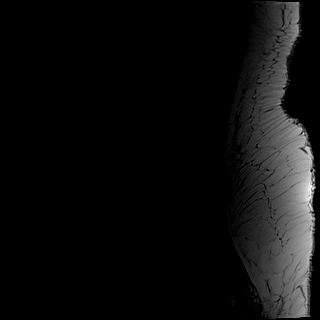
[im 12/15]
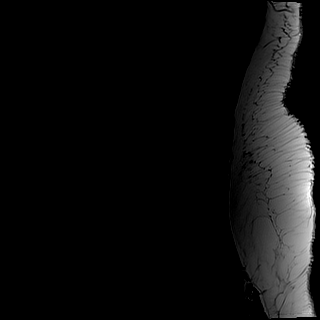
[im 15/15]
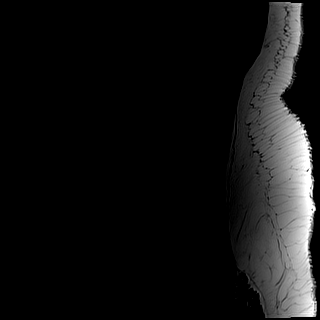

[Series 4: STIR · sagittal · 4.0mm · 1.02mm/px · 6 of 15 slices shown]
[im 1/15]
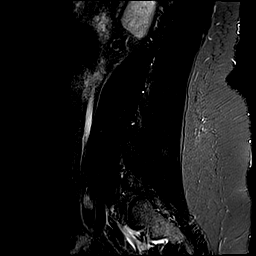
[im 3/15]
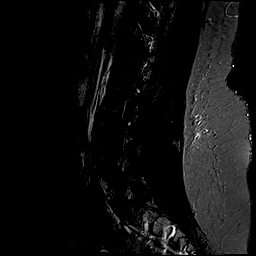
[im 6/15]
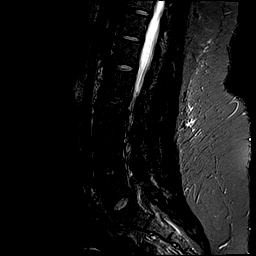
[im 9/15]
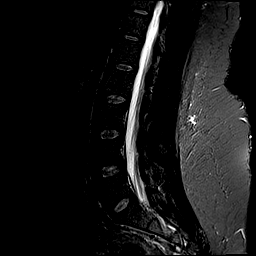
[im 12/15]
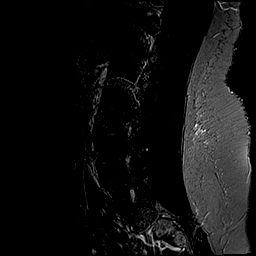
[im 15/15]
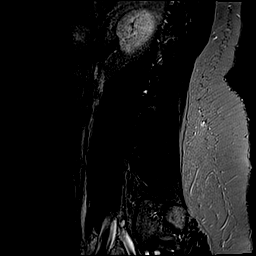

[Series 5: T2 · axial · 4.0mm · 0.78mm/px · z∈[-50,+156]mm · 11 of 39 slices shown (2 of 2)]
[im 1/39]
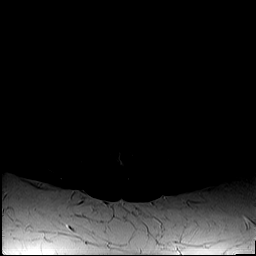
[im 3/39]
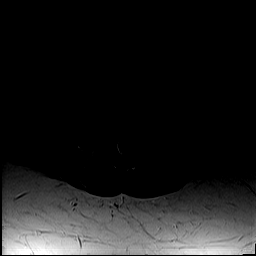
[im 6/39]
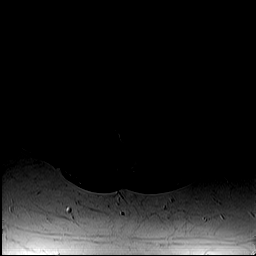
[im 9/39]
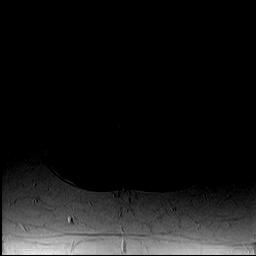
[im 11/39]
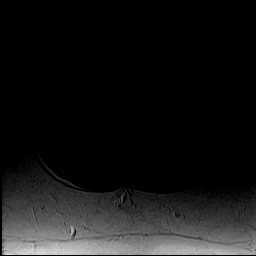
[im 17/39]
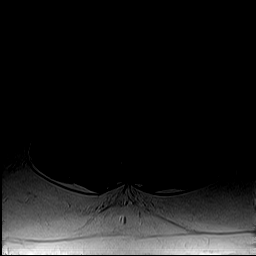
[im 20/39]
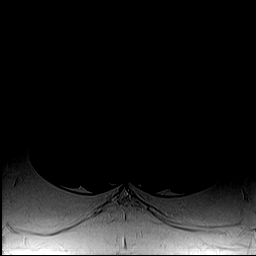
[im 22/39]
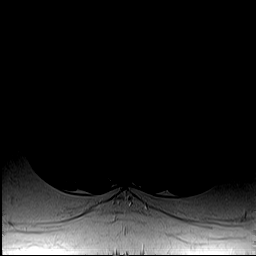
[im 28/39]
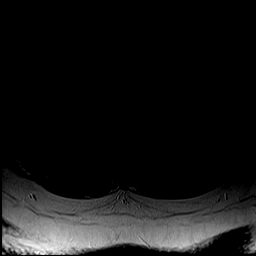
[im 33/39]
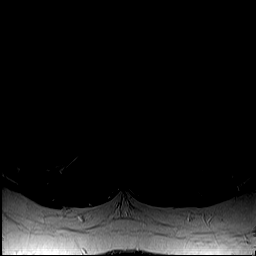
[im 39/39]
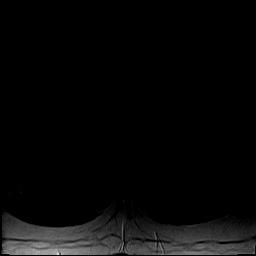

[Series 6: T1 · axial · 4.0mm · 0.39mm/px · z∈[-50,+156]mm · 9 of 39 slices shown (2 of 2)]
[im 1/39]
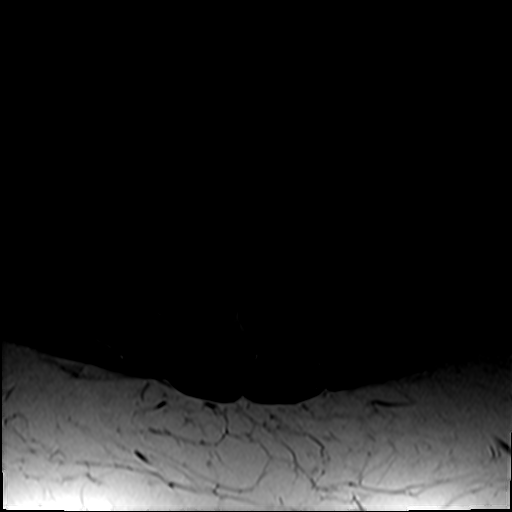
[im 6/39]
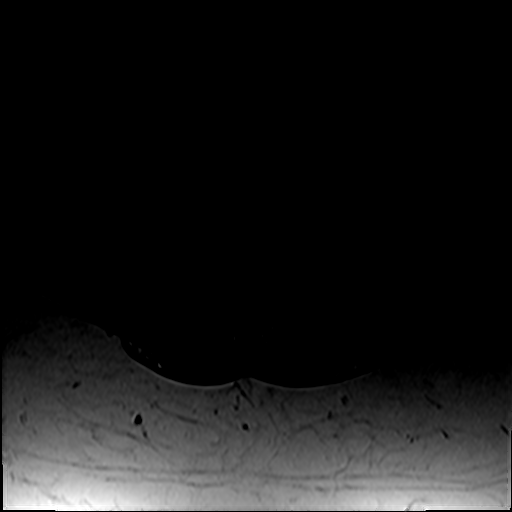
[im 11/39]
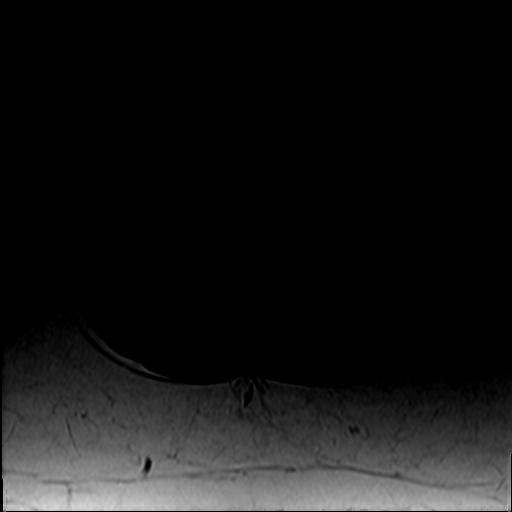
[im 17/39]
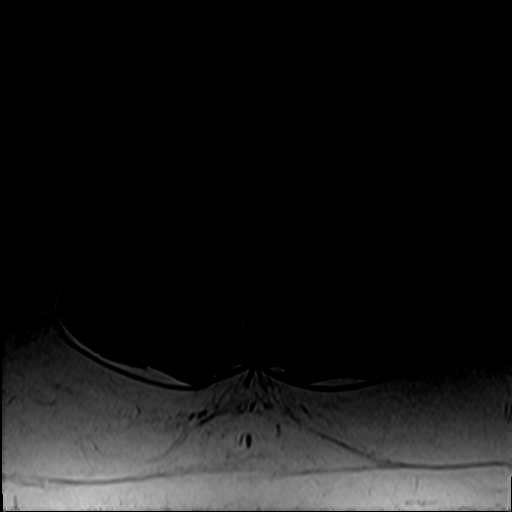
[im 20/39]
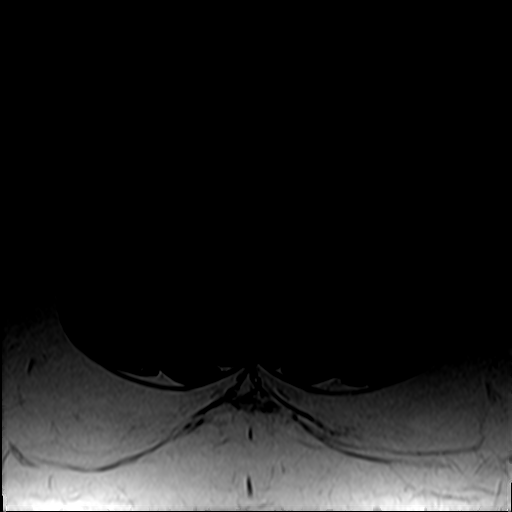
[im 22/39]
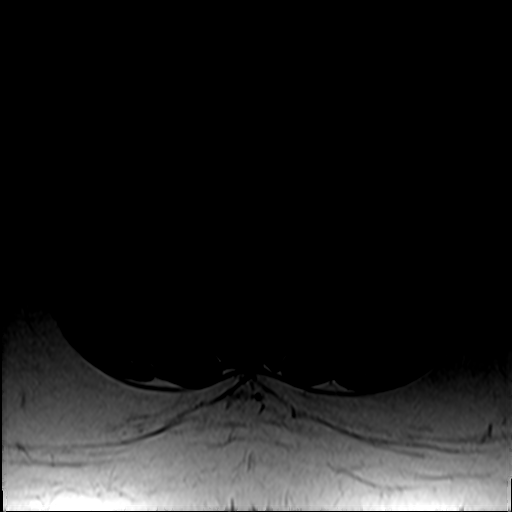
[im 28/39]
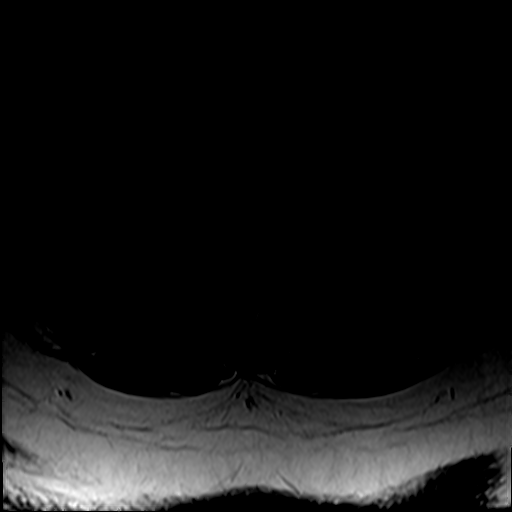
[im 33/39]
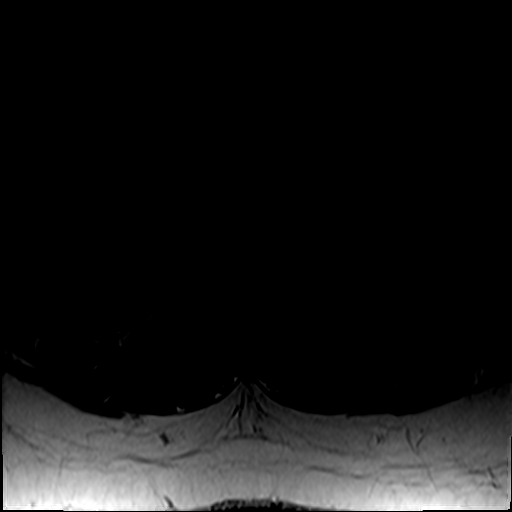
[im 39/39]
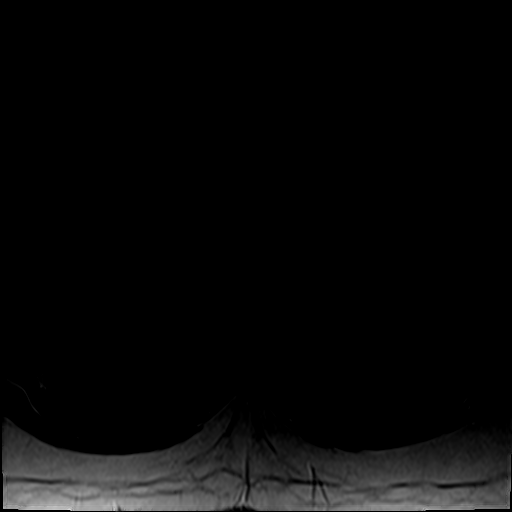

[38 of 48 positions shown; findings below may reference images not displayed]

FINDINGS: Segmentation:  Standard.

Alignment:  Physiologic.

Vertebrae:  No fracture, evidence of discitis, or bone lesion.

Conus medullaris: Extends to the T12-L1 level and appears normal.

Paraspinal and other soft tissues: Negative.

Disc levels:

Disc spaces: Disc spaces are maintained.

T12-L1: No significant disc bulge. No evidence of neural foraminal
stenosis. No central canal stenosis.

L1-L2: No significant disc bulge. No evidence of neural foraminal
stenosis. No central canal stenosis.

L2-L3: No significant disc bulge. No evidence of neural foraminal
stenosis. No central canal stenosis.

L3-L4: No significant disc bulge. No evidence of neural foraminal
stenosis. No central canal stenosis.

L4-L5: No significant disc bulge. No evidence of neural foraminal
stenosis. No central canal stenosis.

L5-S1: No significant disc bulge. No evidence of neural foraminal
stenosis. No central canal stenosis.
IMPRESSION: Normal MRI of the lumbar spine.

## 2017-03-25 DIAGNOSIS — M25551 Pain in right hip: Secondary | ICD-10-CM | POA: Diagnosis not present

## 2017-03-25 DIAGNOSIS — M25651 Stiffness of right hip, not elsewhere classified: Secondary | ICD-10-CM | POA: Diagnosis not present

## 2017-03-25 DIAGNOSIS — M6281 Muscle weakness (generalized): Secondary | ICD-10-CM | POA: Diagnosis not present

## 2017-03-25 DIAGNOSIS — R262 Difficulty in walking, not elsewhere classified: Secondary | ICD-10-CM | POA: Diagnosis not present

## 2017-03-29 ENCOUNTER — Other Ambulatory Visit: Payer: Self-pay | Admitting: Family Medicine

## 2017-03-29 DIAGNOSIS — F5101 Primary insomnia: Secondary | ICD-10-CM

## 2017-04-10 DIAGNOSIS — M25651 Stiffness of right hip, not elsewhere classified: Secondary | ICD-10-CM | POA: Diagnosis not present

## 2017-04-10 DIAGNOSIS — M25551 Pain in right hip: Secondary | ICD-10-CM | POA: Diagnosis not present

## 2017-04-10 DIAGNOSIS — M545 Low back pain: Secondary | ICD-10-CM | POA: Diagnosis not present

## 2017-04-10 DIAGNOSIS — M6281 Muscle weakness (generalized): Secondary | ICD-10-CM | POA: Diagnosis not present

## 2017-04-22 DIAGNOSIS — M25551 Pain in right hip: Secondary | ICD-10-CM | POA: Diagnosis not present

## 2017-04-22 DIAGNOSIS — M6281 Muscle weakness (generalized): Secondary | ICD-10-CM | POA: Diagnosis not present

## 2017-04-22 DIAGNOSIS — M25651 Stiffness of right hip, not elsewhere classified: Secondary | ICD-10-CM | POA: Diagnosis not present

## 2017-04-22 DIAGNOSIS — M545 Low back pain: Secondary | ICD-10-CM | POA: Diagnosis not present

## 2017-04-24 DIAGNOSIS — M25551 Pain in right hip: Secondary | ICD-10-CM | POA: Diagnosis not present

## 2017-04-24 DIAGNOSIS — M25651 Stiffness of right hip, not elsewhere classified: Secondary | ICD-10-CM | POA: Diagnosis not present

## 2017-04-24 DIAGNOSIS — M545 Low back pain: Secondary | ICD-10-CM | POA: Diagnosis not present

## 2017-04-24 DIAGNOSIS — M6281 Muscle weakness (generalized): Secondary | ICD-10-CM | POA: Diagnosis not present

## 2017-04-29 DIAGNOSIS — M545 Low back pain: Secondary | ICD-10-CM | POA: Diagnosis not present

## 2017-04-29 DIAGNOSIS — M25651 Stiffness of right hip, not elsewhere classified: Secondary | ICD-10-CM | POA: Diagnosis not present

## 2017-04-29 DIAGNOSIS — M25551 Pain in right hip: Secondary | ICD-10-CM | POA: Diagnosis not present

## 2017-04-29 DIAGNOSIS — M6281 Muscle weakness (generalized): Secondary | ICD-10-CM | POA: Diagnosis not present

## 2017-05-09 ENCOUNTER — Encounter: Payer: Self-pay | Admitting: Nurse Practitioner

## 2017-05-09 ENCOUNTER — Ambulatory Visit (INDEPENDENT_AMBULATORY_CARE_PROVIDER_SITE_OTHER): Payer: BLUE CROSS/BLUE SHIELD | Admitting: Nurse Practitioner

## 2017-05-09 ENCOUNTER — Ambulatory Visit: Payer: BLUE CROSS/BLUE SHIELD | Admitting: Nurse Practitioner

## 2017-05-09 VITALS — BP 116/74 | HR 88 | Temp 98.1°F | Resp 14 | Ht 62.0 in | Wt 258.7 lb

## 2017-05-09 DIAGNOSIS — F5101 Primary insomnia: Secondary | ICD-10-CM

## 2017-05-09 DIAGNOSIS — I10 Essential (primary) hypertension: Secondary | ICD-10-CM

## 2017-05-09 DIAGNOSIS — Z1211 Encounter for screening for malignant neoplasm of colon: Secondary | ICD-10-CM | POA: Diagnosis not present

## 2017-05-09 DIAGNOSIS — Z5181 Encounter for therapeutic drug level monitoring: Secondary | ICD-10-CM

## 2017-05-09 DIAGNOSIS — E559 Vitamin D deficiency, unspecified: Secondary | ICD-10-CM | POA: Diagnosis not present

## 2017-05-09 LAB — COMPLETE METABOLIC PANEL WITH GFR
AG Ratio: 1.3 (calc) (ref 1.0–2.5)
ALT: 42 U/L — ABNORMAL HIGH (ref 6–29)
AST: 31 U/L (ref 10–35)
Albumin: 4.2 g/dL (ref 3.6–5.1)
Alkaline phosphatase (APISO): 104 U/L (ref 33–130)
BUN: 14 mg/dL (ref 7–25)
CO2: 29 mmol/L (ref 20–32)
Calcium: 9.7 mg/dL (ref 8.6–10.4)
Chloride: 104 mmol/L (ref 98–110)
Creat: 1.04 mg/dL (ref 0.50–1.05)
GFR, Est African American: 72 mL/min/{1.73_m2} (ref >=60)
GFR, Est Non African American: 62 mL/min/{1.73_m2} (ref >=60)
Globulin: 3.2 g/dL (calc) (ref 1.9–3.7)
Glucose, Bld: 92 mg/dL (ref 65–99)
Potassium: 3.2 mmol/L — ABNORMAL LOW (ref 3.5–5.3)
Sodium: 140 mmol/L (ref 135–146)
Total Bilirubin: 0.8 mg/dL (ref 0.2–1.2)
Total Protein: 7.4 g/dL (ref 6.1–8.1)

## 2017-05-09 MED ORDER — TEMAZEPAM 15 MG PO CAPS: 15.0000 mg | ORAL_CAPSULE | Freq: Every day | ORAL | 0 refills | Status: DC

## 2017-05-09 MED ORDER — AMLODIPINE BESYLATE 5 MG PO TABS: 5.0000 mg | ORAL_TABLET | Freq: Every day | ORAL | 1 refills | Status: DC

## 2017-05-09 MED ORDER — TELMISARTAN-HCTZ 80-25 MG PO TABS: 1.0000 | ORAL_TABLET | Freq: Every day | ORAL | 1 refills | Status: DC

## 2017-05-09 NOTE — Progress Notes (Addendum)
Name: Deanna Perkins   MRN: [id number]    DOB: 1965-04-22   Date:05/09/2017       Progress Note  Subjective  Chief Complaint  Chief Complaint  Patient presents with  . Medication Refill    HPI  Hypertension Patient is on amlodipine 5mg , telmisartan 80 and hctz 25.  Takes medications as prescribed with 0 missed doses a month.  She is compliant with low-salt diet.  Yes checks blood pressures at home with range of 120's/70-82 Denies chest pain, headaches, blurry vision.  Insomnia Patient has problems getting to sleep and staying asleep- started when her mom got sick and passed away. Talked to counselor- doesn't have anxiety related it. Takes medicine as needed when she is feeling restless. Takes them maybe 3-5 times a week. Pt gets good 7 hours of sleep on medicine and feels rested in the morning.   Vitamin D deficiency Stopped rx meds a few months ago, states goes out in the sun more. Eating fortified raisin bran.    Patient Active Problem List   Diagnosis Date Noted  . Pre-diabetes 11/19/2015  . Obesity, Class III, BMI 40-49.9 (morbid obesity) (HCC) 09/20/2014  . Back pain, chronic 09/17/2014  . Benign essential HTN 09/17/2014  . History of cataract surgery 09/17/2014  . Dyslipidemia 09/17/2014  . Iridocyclitis 09/17/2014  . Osteoarthritis of both knees 09/17/2014  . History of menorrhagia 09/17/2014  . Insomnia 09/17/2014  . Vitamin D deficiency 09/17/2014    Past Medical History:  Diagnosis Date  . Back pain   . Cataracts, bilateral   . Excessive or frequent menstruation   . Hyperlipidemia   . Hypertension   . Insomnia   . Obesity   . Osteoarthritis   . Vitamin D deficiency     Past Surgical History:  Procedure Laterality Date  . BREAST SURGERY     reduction  . CERVICAL CERCLAGE    . DILATION AND CURETTAGE OF UTERUS  2008  . EYE SURGERY Bilateral    cataracts  . foot suregry      Social History   Tobacco Use  . Smoking status: Never Smoker  .  Smokeless tobacco: Never Used  Substance Use Topics  . Alcohol use: Yes    Alcohol/week: 0.0 oz    Comment: rarely     Current Outpatient Medications:  .  amLODipine (NORVASC) 5 MG tablet, Take 1 tablet (5 mg total) daily by mouth. for blood pressure, Disp: 90 tablet, Rfl: 1 .  telmisartan-hydrochlorothiazide (MICARDIS HCT) 80-25 MG tablet, Take 1 tablet daily by mouth., Disp: 90 tablet, Rfl: 1 .  temazepam (RESTORIL) 15 MG capsule, Take 1 capsule (15 mg total) at bedtime by mouth., Disp: 90 capsule, Rfl: 0  Allergies  Allergen Reactions  . Chocolate Hives    ROS  Constitutional: Negative for fever or weight change.  Respiratory: Negative for cough and shortness of breath.   Cardiovascular: Negative for chest pain or palpitations.  Gastrointestinal: Negative for abdominal pain, no bowel changes.  Musculoskeletal: Negative for gait problem or joint swelling.  Skin: Negative for rash.  Neurological: Negative for dizziness or headache.  No other specific complaints in a complete review of systems (except as listed in HPI above).  Objective  Vitals:   05/09/17 1608  BP: 116/74  Pulse: 88  Resp: 14  Temp: 98.1 F (36.7 C)  TempSrc: Oral  SpO2: 95%  Weight: 258 lb 11.2 oz (117.3 kg)  Height: 5\' 2"  (1.575 m)    Body mass  index is 47.32 kg/m.  Nursing Note and Vital Signs reviewed.  Physical Exam  Constitutional: Patient appears well-developed and well-nourished. Obese  No distress.  Cardiovascular: Normal rate, regular rhythm, S1/S2 present.  No murmur or rub heard.  Pulmonary/Chest: Effort normal and breath sounds clear. No respiratory distress or retractions. Psychiatric: Patient has a normal mood and affect. behavior is normal. Judgment and thought content normal.  No results found for this or any previous visit (from the past 72 hour(s)).  Assessment & Plan  1. Benign essential HTN Stable, continue current therapies. DASH diet, weight loss  - COMPLETE  METABOLIC PANEL WITH GFR - amLODipine (NORVASC) 5 MG tablet; Take 1 tablet (5 mg total) by mouth daily. for blood pressure  Dispense: 90 tablet; Refill: 1 - telmisartan-hydrochlorothiazide (MICARDIS HCT) 80-25 MG tablet; Take 1 tablet by mouth daily.  Dispense: 90 tablet; Refill: 1  2. Primary insomnia - meditation, discussed sleep hygiene, patient looked up on [place] OD risk score of 10. Using same pharmacy, received scripts only from this clinic.  - temazepam (RESTORIL) 15 MG capsule; Take 1 capsule (15 mg total) by mouth at bedtime.  Dispense: 90 capsule; Refill: 0  3. Vitamin D deficiency - continue diet management  - Vitamin D (25 hydroxy)  4. Medication monitoring encounter - COMPLETE METABOLIC PANEL WITH GFR  5. Screening for colon cancer - Ambulatory referral to Gastroenterology    -Red flags and when to present for emergency care or RTC including fever >101.67F, chest pain, shortness of breath, new/worsening/un-resolving symptoms,  reviewed with patient at time of visit. Follow up and care instructions discussed and provided in AVS. -Reviewed Health Maintenance: GI consult for colonoscopy, and mammogram number given   ----------------------------------- I have reviewed this encounter including the documentation in this note and/or discussed this patient with the provider, Sharyon Cable DNP AGNP-C. I am certifying that I agree with the content of this note as supervising physician. Baruch Gouty, MD [facility] 05/30/2017, 12:20 PM

## 2017-05-09 NOTE — Addendum Note (Signed)
Addended by: Cheryle Horsfall on: 05/09/2017 04:42 PM   Modules accepted: Orders

## 2017-05-09 NOTE — Patient Instructions (Addendum)
Please do call to schedule your mammogram; the number to schedule one at either [facility] or [facility] is [phone]  Insomnia  Follow these instructions at home:  Take medicines only as directed by your health care provider.  Keep regular sleeping and waking hours. Avoid naps.  Keep a sleep diary to help you and your health care provider figure out what could be causing your insomnia. Include: ? When you sleep. ? When you wake up during the night. ? How well you sleep. ? How rested you feel the next day. ? Any side effects of medicines you are taking. ? What you eat and drink.  Make your bedroom a comfortable place where it is easy to fall asleep: ? Put up shades or special blackout curtains to block light from outside. ? Use a white noise machine to block noise. ? Keep the temperature cool.  Exercise regularly as directed by your health care provider. Avoid exercising right before bedtime.  Use relaxation techniques to manage stress. Ask your health care provider to suggest some techniques that may work well for you. These may include: ? Breathing exercises. ? Routines to release muscle tension. ? Visualizing peaceful scenes.  Cut back on alcohol, caffeinated beverages, and cigarettes, especially close to bedtime. These can disrupt your sleep.  Do not overeat or eat spicy foods right before bedtime. This can lead to digestive discomfort that can make it hard for you to sleep.  Limit screen use before bedtime. This includes: ? Watching TV. ? Using your smartphone, tablet, and computer.  Stick to a routine. This can help you fall asleep faster. Try to do a quiet activity, brush your teeth, and go to bed at the same time each night.  Get out of bed if you are still awake after 15 minutes of trying to sleep. Keep the lights down, but try reading or doing a quiet activity. When you feel sleepy, go back to bed.  Make sure that you drive  carefully. Avoid driving if you feel very sleepy.  Keep all follow-up appointments as directed by your health care provider. This is important. Contact a health care provider if:  You are tired throughout the day or have trouble in your daily routine due to sleepiness.  You continue to have sleep problems or your sleep problems get worse. Get help right away if:  You have serious thoughts about hurting yourself or someone else. This information is not intended to replace advice given to you by your health care provider. Make sure you discuss any questions you have with your health care provider. Document Released: 12/22/1999 Document Revised: 05/26/2015 Document Reviewed: 09/24/2013 Elsevier Interactive Patient Education  2018 [employer].   Calorie Counting for [employer] Calories are units of energy. Your body needs a certain amount of calories from food to keep you going throughout the day. When you eat more calories than your body needs, your body stores the extra calories as fat. When you eat fewer calories than your body needs, your body burns fat to get the energy it needs. Calorie counting means keeping track of how many calories you eat and drink each day. Calorie counting can be helpful if you need to lose weight. If you make sure to eat fewer calories than your body needs, you should lose weight. Ask your health care provider what a healthy weight is for you. For calorie counting to work, you will need to eat the right number of calories  in a day in order to lose a healthy amount of weight per week. A dietitian can help you determine how many calories you need in a day and will give you suggestions on how to reach your calorie goal.  A healthy amount of weight to lose per week is usually 1-2 lb (0.5-0.9 kg). This usually means that your daily calorie intake should be reduced by 500-750 calories.  Eating 1,200 - 1,500 calories per day can help most women lose weight.  Eating  1,500 - 1,800 calories per day can help most men lose weight.  What is my plan? My goal is to have __________ calories per day. If I have this many calories per day, I should lose around __________ pounds per week. What do I need to know about calorie counting? In order to meet your daily calorie goal, you will need to:  Find out how many calories are in each food you would like to eat. Try to do this before you eat.  Decide how much of the food you plan to eat.  Write down what you ate and how many calories it had. Doing this is called keeping a food log.  To successfully lose weight, it is important to balance calorie counting with a healthy lifestyle that includes regular activity. Aim for 150 minutes of moderate exercise (such as walking) or 75 minutes of vigorous exercise (such as running) each week. Where do I find calorie information?  The number of calories in a food can be found on a Nutrition Facts label. If a food does not have a Nutrition Facts label, try to look up the calories online or ask your dietitian for help. Remember that calories are listed per serving. If you choose to have more than one serving of a food, you will have to multiply the calories per serving by the amount of servings you plan to eat. For example, the label on a package of bread might say that a serving size is 1 slice and that there are 90 calories in a serving. If you eat 1 slice, you will have eaten 90 calories. If you eat 2 slices, you will have eaten 180 calories. How do I keep a food log? Immediately after each meal, record the following information in your food log:  What you ate. Don't forget to include toppings, sauces, and other extras on the food.  How much you ate. This can be measured in cups, ounces, or number of items.  How many calories each food and drink had.  The total number of calories in the meal.  Keep your food log near you, such as in a small notebook in your pocket, or use a  mobile app or website. Some programs will calculate calories for you and show you how many calories you have left for the day to meet your goal. What are some calorie counting tips?  Use your calories on foods and drinks that will fill you up and not leave you hungry: ? Some examples of foods that fill you up are nuts and nut butters, vegetables, lean proteins, and high-fiber foods like whole grains. High-fiber foods are foods with more than 5 g fiber per serving. ? Drinks such as sodas, specialty coffee drinks, alcohol, and juices have a lot of calories, yet do not fill you up.  Eat nutritious foods and avoid empty calories. Empty calories are calories you get from foods or beverages that do not have many vitamins or protein, such as  candy, sweets, and soda. It is better to have a nutritious high-calorie food (such as an avocado) than a food with few nutrients (such as a bag of chips).  Know how many calories are in the foods you eat most often. This will help you calculate calorie counts faster.  Pay attention to calories in drinks. Low-calorie drinks include water and unsweetened drinks.  Pay attention to nutrition labels for "low fat" or "fat free" foods. These foods sometimes have the same amount of calories or more calories than the full fat versions. They also often have added sugar, starch, or salt, to make up for flavor that was removed with the fat.  Find a way of tracking calories that works for you. Get creative. Try different apps or programs if writing down calories does not work for you. What are some portion control tips?  Know how many calories are in a serving. This will help you know how many servings of a certain food you can have.  Use a measuring cup to measure serving sizes. You could also try weighing out portions on a kitchen scale. With time, you will be able to estimate serving sizes for some foods.  Take some time to put servings of different foods on your favorite  plates, bowls, and cups so you know what a serving looks like.  Try not to eat straight from a bag or box. Doing this can lead to overeating. Put the amount you would like to eat in a cup or on a plate to make sure you are eating the right portion.  Use smaller plates, glasses, and bowls to prevent overeating.  Try not to multitask (for example, watch TV or use your computer) while eating. If it is time to eat, sit down at a table and enjoy your food. This will help you to know when you are full. It will also help you to be aware of what you are eating and how much you are eating. What are tips for following this plan? Reading food labels  Check the calorie count compared to the serving size. The serving size may be smaller than what you are used to eating.  Check the source of the calories. Make sure the food you are eating is high in vitamins and protein and low in saturated and trans fats. Shopping  Read nutrition labels while you shop. This will help you make healthy decisions before you decide to purchase your food.  Make a grocery list and stick to it. Cooking  Try to cook your favorite foods in a healthier way. For example, try baking instead of frying.  Use low-fat dairy products. Meal planning  Use more fruits and vegetables. Half of your plate should be fruits and vegetables.  Include lean proteins like poultry and fish. How do I count calories when eating out?  Ask for smaller portion sizes.  Consider sharing an entree and sides instead of getting your own entree.  If you get your own entree, eat only half. Ask for a box at the beginning of your meal and put the rest of your entree in it so you are not tempted to eat it.  If calories are listed on the menu, choose the lower calorie options.  Choose dishes that include vegetables, fruits, whole grains, low-fat dairy products, and lean protein.  Choose items that are boiled, broiled, grilled, or steamed. Stay away  from items that are buttered, battered, fried, or served with cream sauce. Items labeled "crispy"  are usually fried, unless stated otherwise.  Choose water, low-fat milk, unsweetened iced tea, or other drinks without added sugar. If you want an alcoholic beverage, choose a lower calorie option such as a glass of wine or light beer.  Ask for dressings, sauces, and syrups on the side. These are usually high in calories, so you should limit the amount you eat.  If you want a salad, choose a garden salad and ask for grilled meats. Avoid extra toppings like bacon, cheese, or fried items. Ask for the dressing on the side, or ask for olive oil and vinegar or lemon to use as dressing.  Estimate how many servings of a food you are given. For example, a serving of cooked rice is  cup or about the size of half a baseball. Knowing serving sizes will help you be aware of how much food you are eating at restaurants. The list below tells you how big or small some common portion sizes are based on everyday objects: ? 1 oz-4 stacked dice. ? 3 oz-1 deck of cards. ? 1 tsp-1 die. ? 1 Tbsp- a ping-pong ball. ? 2 Tbsp-1 ping-pong ball. ?  cup- baseball. ? 1 cup-1 baseball. Summary  Calorie counting means keeping track of how many calories you eat and drink each day. If you eat fewer calories than your body needs, you should lose weight.  A healthy amount of weight to lose per week is usually 1-2 lb (0.5-0.9 kg). This usually means reducing your daily calorie intake by 500-750 calories.  The number of calories in a food can be found on a Nutrition Facts label. If a food does not have a Nutrition Facts label, try to look up the calories online or ask your dietitian for help.  Use your calories on foods and drinks that will fill you up, and not on foods and drinks that will leave you hungry.  Use smaller plates, glasses, and bowls to prevent overeating. This information is not intended to replace advice given  to you by your health care provider. Make sure you discuss any questions you have with your health care provider. Document Released: 12/24/2004 Document Revised: 11/24/2015 Document Reviewed: 11/24/2015 Elsevier Interactive Patient Education  [employer].

## 2017-05-10 LAB — VITAMIN D 25 HYDROXY (VIT D DEFICIENCY, FRACTURES): Vit D, 25-Hydroxy: 8 ng/mL — ABNORMAL LOW (ref 30–100)

## 2017-05-12 ENCOUNTER — Other Ambulatory Visit: Payer: Self-pay | Admitting: Nurse Practitioner

## 2017-05-12 DIAGNOSIS — E876 Hypokalemia: Secondary | ICD-10-CM

## 2017-05-12 DIAGNOSIS — E559 Vitamin D deficiency, unspecified: Secondary | ICD-10-CM

## 2017-05-12 MED ORDER — VITAMIN D (ERGOCALCIFEROL) 1.25 MG (50000 UNIT) PO CAPS: 50000.0000 [IU] | ORAL_CAPSULE | ORAL | 0 refills | Status: DC

## 2017-05-23 DIAGNOSIS — H35351 Cystoid macular degeneration, right eye: Secondary | ICD-10-CM | POA: Diagnosis not present

## 2017-08-31 ENCOUNTER — Other Ambulatory Visit: Payer: Self-pay | Admitting: Nurse Practitioner

## 2017-08-31 DIAGNOSIS — F5101 Primary insomnia: Secondary | ICD-10-CM

## 2017-09-01 NOTE — Telephone Encounter (Signed)
Spoke with pt and she did schedule for Sept 16 (the next time she will be off). She is asking for enough to last until then.

## 2017-09-01 NOTE — Telephone Encounter (Signed)
Needs appointment for refills of controlled substance. Thanks. I can refill short term dosage when appointment is made. Preference for Dr. Carlynn Purl if available; but can be with either NP for availability

## 2017-09-03 ENCOUNTER — Telehealth: Payer: Self-pay | Admitting: Nurse Practitioner

## 2017-09-03 DIAGNOSIS — F5101 Primary insomnia: Secondary | ICD-10-CM

## 2017-09-03 NOTE — Telephone Encounter (Signed)
error 

## 2017-09-03 NOTE — Telephone Encounter (Signed)
Route to PCP

## 2017-09-22 ENCOUNTER — Ambulatory Visit (INDEPENDENT_AMBULATORY_CARE_PROVIDER_SITE_OTHER): Payer: BLUE CROSS/BLUE SHIELD | Admitting: Family Medicine

## 2017-09-22 ENCOUNTER — Encounter: Payer: Self-pay | Admitting: Family Medicine

## 2017-09-22 VITALS — BP 130/70 | HR 78 | Temp 98.1°F | Resp 14 | Ht 62.0 in | Wt 255.1 lb

## 2017-09-22 DIAGNOSIS — I10 Essential (primary) hypertension: Secondary | ICD-10-CM | POA: Diagnosis not present

## 2017-09-22 DIAGNOSIS — F5101 Primary insomnia: Secondary | ICD-10-CM

## 2017-09-22 DIAGNOSIS — E559 Vitamin D deficiency, unspecified: Secondary | ICD-10-CM | POA: Diagnosis not present

## 2017-09-22 DIAGNOSIS — Z1231 Encounter for screening mammogram for malignant neoplasm of breast: Secondary | ICD-10-CM

## 2017-09-22 MED ORDER — TEMAZEPAM 15 MG PO CAPS: 15.0000 mg | ORAL_CAPSULE | Freq: Every day | ORAL | 1 refills | Status: DC

## 2017-09-22 MED ORDER — TELMISARTAN-HCTZ 80-25 MG PO TABS: 1.0000 | ORAL_TABLET | Freq: Every day | ORAL | 1 refills | Status: DC

## 2017-09-22 MED ORDER — AMLODIPINE BESYLATE 5 MG PO TABS: 5.0000 mg | ORAL_TABLET | Freq: Every day | ORAL | 1 refills | Status: DC

## 2017-09-22 NOTE — Progress Notes (Signed)
Name: Deanna Perkins   MRN: [id number]    DOB: [birth year]   Date:09/22/2017       Progress Note  Subjective  Chief Complaint  Chief Complaint  Patient presents with  . Medication Refill    HPI  Obesity: Body mass index is 46.66 kg/m. Weight Management History: She is down 4lbs; has right hip pain that limits her, but she is trying to walk more, be more active, eating out less. Diet: Eating out less.  Exercise: moderately active Co-Morbid Conditions: dyslipidemias and hypertension; 2 or more of these conditions combined with BMI >30 is considered morbid obesity; is this diagnosis appropriate and/or added to patient's problem list? No  HTN:  -does take medications as prescribed - current regimen includes Micardis 80-25mg ; amlodipine 5mg .  - taking medications as instructed, no medication side effects noted, no TIAs, no chest pain on exertion, no dyspnea on exertion, no swelling of ankles, no orthostatic dizziness or lightheadedness, no orthopnea or paroxysmal nocturnal dyspnea, no palpitations - DASH diet discussed - pt does not follow a low sodium diet; salt added to cooking and salt shaker on table - The following  are not contributing factors: stress, sedentary lifestyle, corticosteroid use, saturated fats in diet, sweets including black licorice, smoking, sleep apnea, decongestant use.  Insomnia: She is taking restoril as needed for insomnia - number of days she needs it throughout the week varies based on her stress and activity levels. She would like refill of restoril today which we will provide.  White noise of the fan helps her to stay asleep too.  Vitamin D: Finished taking Rx Vitamin D, now going outdoors more often  Hyperlipidemia: HAs not been checked in a few years; would like to check today but she declines lipid panel and other labs today - wants to be fasting and have it done with her CPE.  She is obese, has HTN, no diagnosis of DM.  HM: Mammogram is due - we will  order today.  Colonoscopy is due - she is alone at home now, so she needs to figure out transportation first.   Patient Active Problem List   Diagnosis Date Noted  . Pre-diabetes 11/19/2015  . Obesity, Class III, BMI 40-49.9 (morbid obesity) (HCC) 09/20/2014  . Back pain, chronic 09/17/2014  . Benign essential HTN 09/17/2014  . History of cataract surgery 09/17/2014  . Dyslipidemia 09/17/2014  . Iridocyclitis 09/17/2014  . Osteoarthritis of both knees 09/17/2014  . History of menorrhagia 09/17/2014  . Insomnia 09/17/2014  . Vitamin D deficiency 09/17/2014    Past Surgical History:  Procedure Laterality Date  . BREAST SURGERY     reduction  . CERVICAL CERCLAGE    . DILATION AND CURETTAGE OF UTERUS  2008  . EYE SURGERY Bilateral    cataracts  . foot suregry      Family History  Problem Relation Age of Onset  . Diabetes Mother   . Hypertension Mother   . Stroke Mother   . Cancer Father        lung  . Hypertension Father   . Cancer Maternal Aunt        breast    Social History   Socioeconomic History  . Marital status: Single    Spouse name: Not on file  . Number of children: Not on file  . Years of education: Not on file  . Highest education level: Not on file  Occupational History  . Not on file  Social Needs  .  Financial resource strain: Not on file  . Food insecurity:    Worry: Not on file    Inability: Not on file  . Transportation needs:    Medical: Not on file    Non-medical: Not on file  Tobacco Use  . Smoking status: Never Smoker  . Smokeless tobacco: Never Used  Substance and Sexual Activity  . Alcohol use: Yes    Alcohol/week: 0.0 standard drinks    Comment: rarely  . Drug use: No  . Sexual activity: Not Currently    Birth control/protection: IUD  Lifestyle  . Physical activity:    Days per week: Not on file    Minutes per session: Not on file  . Stress: Not on file  Relationships  . Social connections:    Talks on phone: Not on file      Gets together: Not on file    Attends religious service: Not on file    Active member of club or organization: Not on file    Attends meetings of clubs or organizations: Not on file    Relationship status: Not on file  . Intimate partner violence:    Fear of current or ex partner: Not on file    Emotionally abused: Not on file    Physically abused: Not on file    Forced sexual activity: Not on file  Other Topics Concern  . Not on file  Social History Narrative  . Not on file     Current Outpatient Medications:  .  amLODipine (NORVASC) 5 MG tablet, Take 1 tablet (5 mg total) by mouth daily. for blood pressure, Disp: 90 tablet, Rfl: 1 .  telmisartan-hydrochlorothiazide (MICARDIS HCT) 80-25 MG tablet, Take 1 tablet by mouth daily., Disp: 90 tablet, Rfl: 1 .  temazepam (RESTORIL) 15 MG capsule, Take 1 capsule (15 mg total) by mouth at bedtime., Disp: 90 capsule, Rfl: 0 .  Vitamin D, Ergocalciferol, (DRISDOL) 50000 units CAPS capsule, Take 1 capsule (50,000 Units total) by mouth every 7 (seven) days. (Patient not taking: Reported on 09/22/2017), Disp: 6 capsule, Rfl: 0  Allergies  Allergen Reactions  . Chocolate Hives    I personally reviewed active problem list, medication list, allergies, family history, social history, health maintenance with the patient/caregiver today.   ROS Constitutional: Negative for fever or weight change.  Respiratory: Negative for cough and shortness of breath.   Cardiovascular: Negative for chest pain or palpitations.  Gastrointestinal: Negative for abdominal pain, no bowel changes.  Musculoskeletal: Negative for gait problem or joint swelling.  Skin: Negative for rash.  Neurological: Negative for dizziness or headache.  No other specific complaints in a complete review of systems (except as listed in HPI above).  Objective  Vitals:   09/22/17 0907  BP: 130/70  Pulse: 78  Resp: 14  Temp: 98.1 F (36.7 C)  TempSrc: Oral  SpO2: 96%  Weight:  255 lb 1.6 oz (115.7 kg)  Height: 5\' 2"  (1.575 m)   Body mass index is 46.66 kg/m.  Physical Exam Constitutional: Patient appears well-developed and well-nourished. No distress.  HENT: Head: Normocephalic and atraumatic. Ears: bilateral TMs with no erythema or effusion; Nose: Nose normal. Mouth/Throat: Oropharynx is clear and moist. No oropharyngeal exudate or tonsillar swelling.  Eyes: Conjunctivae and EOM are normal. No scleral icterus.  Pupils are equal, round, and reactive to light.  Neck: Normal range of motion. Neck supple. No JVD present. Cardiovascular: Normal rate, regular rhythm and normal heart sounds.  No murmur heard. No  BLE edema. Pulmonary/Chest: Effort normal and breath sounds normal. No respiratory distress. Abdominal: Soft. Bowel sounds are normal, no distension. There is no tenderness. No masses. Musculoskeletal: Normal range of motion, no joint effusions. No gross deformities Neurological: Pt is alert and oriented to person, place, and time. No cranial nerve deficit. Coordination, balance, strength, speech and gait are normal.  Skin: Skin is warm and dry. No rash noted. No erythema.  Psychiatric: Patient has a normal mood and affect. behavior is normal. Judgment and thought content normal.  No results found for this or any previous visit (from the past 72 hour(s)).  PHQ2/9: Depression screen [facility] 2/9 09/22/2017 05/09/2017 05/06/2016 11/15/2015 05/24/2015  Decreased Interest 0 0 0 0 0  Down, Depressed, Hopeless 0 0 0 0 0  PHQ - 2 Score 0 0 0 0 0  Altered sleeping 1 - - - -  Tired, decreased energy 0 - - - -  Change in appetite 0 - - - -  Feeling bad or failure about yourself  0 - - - -  Trouble concentrating 0 - - - -  Moving slowly or fidgety/restless 0 - - - -  Suicidal thoughts 0 - - - -  PHQ-9 Score 1 - - - -  Difficult doing work/chores Not difficult at all - - - -   Fall Risk: Fall Risk  09/22/2017 05/09/2017 11/11/2016 05/06/2016 11/15/2015  Falls in the past year?  No No Yes No Yes  Number falls in past yr: - - 1 - 2 or more  Injury with Fall? - - No - Yes  Comment - - - - Hurt her left knee in early October  Follow up - - Education provided - -   Assessment & Plan  1. Primary insomnia - Stable; discussed sleep hygiene in detail - temazepam (RESTORIL) 15 MG capsule; Take 1 capsule (15 mg total) by mouth at bedtime.  Dispense: 90 capsule; Refill: 1  2. Benign essential HTN - DASH diet discussed - telmisartan-hydrochlorothiazide (MICARDIS HCT) 80-25 MG tablet; Take 1 tablet by mouth daily.  Dispense: 90 tablet; Refill: 1 - amLODipine (NORVASC) 5 MG tablet; Take 1 tablet (5 mg total) by mouth daily. for blood pressure  Dispense: 90 tablet; Refill: 1  3. Vitamin D deficiency - Get outdoors time; she declines labs today; does not want to take supplement and wants to just get outdoors more often.  Discussed her significantly low levels at last visit, but she declines labs.  4. Obesity, Class III, BMI 40-49.9 (morbid obesity) (HCC) - Discussed importance of 150 minutes of physical activity weekly, eat two servings of fish weekly, eat one serving of tree nuts ( cashews, pistachios, pecans, almonds.Marland Kitchen) every other day, eat 6 servings of fruit/vegetables daily and drink plenty of water and avoid sweet beverages.   5. Morbid obesity (HCC) - Down 4lbs; discussed increased risk of complex medical conditions including DM, MI, Stroke, HTN, HLD.  6. Breast cancer screening by mammogram - MM DIGITAL SCREENING BILATERAL; Future

## 2017-09-22 NOTE — Patient Instructions (Addendum)
Sleep Hygiene Tips 1) Get regular. One of the best ways to train your body to sleep well is to go to bed and get up at more or less the same time every day, even on weekends and days off! This regular rhythm will make you feel better and will give your body something to work from. 2) Sleep when sleepy. Only try to sleep when you actually feel tired or sleepy, rather than spending too much time awake in bed. 3) Get up & try again. If you haven't been able to get to sleep after about 20 minutes or more, get up and do something calming or boring until you feel sleepy, then return to bed and try again. Sit quietly on the couch with the lights off (bright light will tell your brain that it is time to wake up), or read something boring like the phone book. Avoid doing anything that is too stimulating or interesting, as this will wake you up even more. 4) Avoid caffeine & nicotine. It is best to avoid consuming any caffeine (in coffee, tea, cola drinks, chocolate, and some medications) or nicotine (cigarettes) for at least 4-6 hours before going to bed. These substances act as stimulants and interfere with the ability to fall asleep 5) Avoid alcohol. It is also best to avoid alcohol for at least 4-6 hours before going to bed. Many people believe that alcohol is relaxing and helps them to get to sleep at first, but it actually interrupts the quality of sleep. 6) Bed is for sleeping. Try not to use your bed for anything other than sleeping and sex, so that your body comes to associate bed with sleep. If you use bed as a place to watch TV, eat, read, work on your laptop, pay bills, and other things, your body will not learn this Connection. 7) No naps. It is best to avoid taking naps during the day, to make sure that you are tired at bedtime. If you can't make it through the day without a nap, make sure it is for less than an hour and before 3pm. 8) Sleep rituals. You can develop your  own rituals of things to remind your body that it is time to sleep - some people find it useful to do relaxing stretches or breathing exercises for 15 minutes before bed each night, or sit calmly with a cup of caffeine-free tea. 9) Bathtime. Having a hot bath 1-2 hours before bedtime can be useful, as it will raise your body temperature, causing you to feel sleepy as your body temperature drops again. Research shows that sleepiness is associated with a drop in body temperature. 10) No clock-watching. Many people who struggle with sleep tend to watch the clock too much. Frequently checking the clock during the night can wake you up (especially if you turn on the light to read the time) and reinforces negative thoughts such as "Oh no, look how late it is, I'll never get to sleep" or "it's so early, I have only slept for 5 hours, this is terrible." 11) Use a sleep diary. This worksheet can be a useful way of making sure you have the right facts about your sleep, rather than making assumptions. Because a diary involves watching the clock (see point 10) it is a good idea to only use it for two weeks to get an idea of what is going and then perhaps two months down the track to see how you are progressing. 12) Exercise. Regular exercise is  a good idea to help with good sleep, but try not to do strenuous exercise in the 4 hours before bedtime. Morning walks are a great way to start the day feeling refreshed! 13) Eat right. A healthy, balanced diet will help you to sleep well, but timing is important. Some people find that a very empty stomach at bedtime is distracting, so it can be useful to have a light snack, but a heavy meal soon before bed can also interrupt sleep. Some people recommend a warm glass of milk, which contains tryptophan, which acts as a natural sleep inducer. 14) The right space. It is very important that your bed and bedroom are quiet and comfortable for sleeping. A  cooler room with enough blankets to stay warm is best, and make sure you have curtains or an eyemask to block out early morning light and earplugs if there is noise outside your room. 15) Keep daytime routine the same. Even if you have a bad night sleep and are tired it is impor DASH Eating Plan DASH stands for "Dietary Approaches to Stop Hypertension." The DASH eating plan is a healthy eating plan that has been shown to reduce high blood pressure (hypertension). It may also reduce your risk for type 2 diabetes, heart disease, and stroke. The DASH eating plan may also help with weight loss. What are tips for following this plan? General guidelines  Avoid eating more than 2,300 mg (milligrams) of salt (sodium) a day. If you have hypertension, you may need to reduce your sodium intake to 1,500 mg a day.  Limit alcohol intake to no more than 1 drink a day for nonpregnant women and 2 drinks a day for men. One drink equals 12 oz of beer, 5 oz of wine, or 1 oz of hard liquor.  Work with your health care provider to maintain a healthy body weight or to lose weight. Ask what an ideal weight is for you.  Get at least 30 minutes of exercise that causes your heart to beat faster (aerobic exercise) most days of the week. Activities may include walking, swimming, or biking.  Work with your health care provider or diet and nutrition specialist (dietitian) to adjust your eating plan to your individual calorie needs. Reading food labels  Check food labels for the amount of sodium per serving. Choose foods with less than 5 percent of the Daily Value of sodium. Generally, foods with less than 300 mg of sodium per serving fit into this eating plan.  To find whole grains, look for the word "whole" as the first word in the ingredient list. Shopping  Buy products labeled as "low-sodium" or "no salt added."  Buy fresh foods. Avoid canned foods and premade or frozen meals. Cooking  Avoid adding salt when  cooking. Use salt-free seasonings or herbs instead of table salt or sea salt. Check with your health care provider or pharmacist before using salt substitutes.  Do not fry foods. Cook foods using healthy methods such as baking, boiling, grilling, and broiling instead.  Cook with heart-healthy oils, such as olive, canola, soybean, or sunflower oil. Meal planning   Eat a balanced diet that includes: ? 5 or more servings of fruits and vegetables each day. At each meal, try to fill half of your plate with fruits and vegetables. ? Up to 6-8 servings of whole grains each day. ? Less than 6 oz of lean meat, poultry, or fish each day. A 3-oz serving of meat is about the same size  as a deck of cards. One egg equals 1 oz. ? 2 servings of low-fat dairy each day. ? A serving of nuts, seeds, or beans 5 times each week. ? Heart-healthy fats. Healthy fats called Omega-3 fatty acids are found in foods such as flaxseeds and coldwater fish, like sardines, salmon, and mackerel.  Limit how much you eat of the following: ? Canned or prepackaged foods. ? Food that is high in trans fat, such as fried foods. ? Food that is high in saturated fat, such as fatty meat. ? Sweets, desserts, sugary drinks, and other foods with added sugar. ? Full-fat dairy products.  Do not salt foods before eating.  Try to eat at least 2 vegetarian meals each week.  Eat more home-cooked food and less restaurant, buffet, and fast food.  When eating at a restaurant, ask that your food be prepared with less salt or no salt, if possible. What foods are recommended? The items listed may not be a complete list. Talk with your dietitian about what dietary choices are best for you. Grains Whole-grain or whole-wheat bread. Whole-grain or whole-wheat pasta. Brown rice. Orpah Cobb. Bulgur. Whole-grain and low-sodium cereals. Pita bread. Low-fat, low-sodium crackers. Whole-wheat flour tortillas. Vegetables Fresh or frozen vegetables  (raw, steamed, roasted, or grilled). Low-sodium or reduced-sodium tomato and vegetable juice. Low-sodium or reduced-sodium tomato sauce and tomato paste. Low-sodium or reduced-sodium canned vegetables. Fruits All fresh, dried, or frozen fruit. Canned fruit in natural juice (without added sugar). Meat and other protein foods Skinless chicken or [place]. Ground chicken or [place]. Pork with fat trimmed off. Fish and seafood. Egg whites. Dried beans, peas, or lentils. Unsalted nuts, nut butters, and seeds. Unsalted canned beans. Lean cuts of beef with fat trimmed off. Low-sodium, lean deli meat. Dairy Low-fat (1%) or fat-free (skim) milk. Fat-free, low-fat, or reduced-fat cheeses. Nonfat, low-sodium ricotta or cottage cheese. Low-fat or nonfat yogurt. Low-fat, low-sodium cheese. Fats and oils Soft margarine without trans fats. Vegetable oil. Low-fat, reduced-fat, or light mayonnaise and salad dressings (reduced-sodium). Canola, safflower, olive, soybean, and sunflower oils. Avocado. Seasoning and other foods Herbs. Spices. Seasoning mixes without salt. Unsalted popcorn and pretzels. Fat-free sweets. What foods are not recommended? The items listed may not be a complete list. Talk with your dietitian about what dietary choices are best for you. Grains Baked goods made with fat, such as croissants, muffins, or some breads. Dry pasta or rice meal packs. Vegetables Creamed or fried vegetables. Vegetables in a cheese sauce. Regular canned vegetables (not low-sodium or reduced-sodium). Regular canned tomato sauce and paste (not low-sodium or reduced-sodium). Regular tomato and vegetable juice (not low-sodium or reduced-sodium). Rosita Fire. Olives. Fruits Canned fruit in a light or heavy syrup. Fried fruit. Fruit in cream or butter sauce. Meat and other protein foods Fatty cuts of meat. Ribs. Fried meat. Tomasa Blase. Sausage. Bologna and other processed lunch meats. Salami. Fatback. Hotdogs. Bratwurst. Salted nuts  and seeds. Canned beans with added salt. Canned or smoked fish. Whole eggs or egg yolks. Chicken or [place] with skin. Dairy Whole or 2% milk, cream, and half-and-half. Whole or full-fat cream cheese. Whole-fat or sweetened yogurt. Full-fat cheese. Nondairy creamers. Whipped toppings. Processed cheese and cheese spreads. Fats and oils Butter. Stick margarine. Lard. Shortening. Ghee. Bacon fat. Tropical oils, such as coconut, palm kernel, or palm oil. Seasoning and other foods Salted popcorn and pretzels. Onion salt, garlic salt, seasoned salt, table salt, and sea salt. Worcestershire sauce. Tartar sauce. Barbecue sauce. Teriyaki sauce. Soy sauce, including reduced-sodium. Steak  sauce. Canned and packaged gravies. Fish sauce. Oyster sauce. Cocktail sauce. Horseradish that you find on the shelf. Ketchup. Mustard. Meat flavorings and tenderizers. Bouillon cubes. Hot sauce and Tabasco sauce. Premade or packaged marinades. Premade or packaged taco seasonings. Relishes. Regular salad dressings. Where to find more information:  National Heart, Lung, and Blood Institute: PopSteam.is  American Heart Association: www.heart.org Summary  The DASH eating plan is a healthy eating plan that has been shown to reduce high blood pressure (hypertension). It may also reduce your risk for type 2 diabetes, heart disease, and stroke.  With the DASH eating plan, you should limit salt (sodium) intake to 2,300 mg a day. If you have hypertension, you may need to reduce your sodium intake to 1,500 mg a day.  When on the DASH eating plan, aim to eat more fresh fruits and vegetables, whole grains, lean proteins, low-fat dairy, and heart-healthy fats.  Work with your health care provider or diet and nutrition specialist (dietitian) to adjust your eating plan to your individual calorie needs. This information is not intended to replace advice given to you by your health care provider. Make sure you discuss any questions  you have with your health care provider. Document Released: 12/13/2010 Document Revised: 12/18/2015 Document Reviewed: 12/18/2015 Elsevier Interactive Patient Education  2018 [employer].  tant that you try to keep your daytime activities the same as you had planned. That is, don't avoid activities because you feel tired. This can reinforce the insomnia.

## 2017-09-22 NOTE — Addendum Note (Signed)
Addended by: Tian Davison, Sherrill Raring on: 09/22/2017 09:45 AM   Modules accepted: Orders

## 2018-03-11 ENCOUNTER — Telehealth: Payer: Self-pay | Admitting: Family Medicine

## 2018-03-11 DIAGNOSIS — I10 Essential (primary) hypertension: Secondary | ICD-10-CM

## 2018-03-11 NOTE — Telephone Encounter (Signed)
Copied from CRM [id number]. Topic: Quick Communication - Rx Refill/Question >> Mar 11, 2018  9:17 AM Angela Nevin wrote: Medication: temazepam (RESTORIL) 15 MG capsule, telmisartan-hydrochlorothiazide (MICARDIS HCT) 80-25 MG tablet and amLODipine (NORVASC) 5 MG tablet  Patient is requesting 30 day supply of these medications    Preferred Pharmacy (with phone number or street name):[facility] [address], [place] - [address] [phone] (Phone) [phone] (Fax)

## 2018-03-13 ENCOUNTER — Other Ambulatory Visit: Payer: Self-pay

## 2018-03-13 DIAGNOSIS — F5101 Primary insomnia: Secondary | ICD-10-CM

## 2018-03-13 MED ORDER — TELMISARTAN-HCTZ 80-25 MG PO TABS: 1.0000 | ORAL_TABLET | Freq: Every day | ORAL | 0 refills | Status: DC

## 2018-03-13 MED ORDER — AMLODIPINE BESYLATE 5 MG PO TABS
5.0000 mg | ORAL_TABLET | Freq: Every day | ORAL | 0 refills | Status: DC
Start: 2018-03-13 — End: 2018-03-17

## 2018-03-13 NOTE — Telephone Encounter (Signed)
Copied from CRM [id number]. Topic: General - Other >> Mar 12, 2018  3:28 PM Percival Spanish wrote: Pt call to say the pharmacy has her bp med but not her sleep med and is asking if this is going to be refilled  temazepam (RESTORIL) 15 MG capsule   [facility]

## 2018-03-13 NOTE — Telephone Encounter (Signed)
Called patient to inform her no answer, no answer.

## 2018-03-13 NOTE — Telephone Encounter (Signed)
Temazepam is controlled, so she needs to come to her visit for this.  I sent in 30-day supplies of the amlodipine and micardis.

## 2018-03-15 NOTE — Telephone Encounter (Signed)
She needs follow up

## 2018-03-16 NOTE — Telephone Encounter (Signed)
Pt has appt with Irving Burton for 3.10.2020

## 2018-03-17 ENCOUNTER — Ambulatory Visit: Payer: BLUE CROSS/BLUE SHIELD | Admitting: Family Medicine

## 2018-03-17 ENCOUNTER — Encounter: Payer: Self-pay | Admitting: Family Medicine

## 2018-03-17 VITALS — BP 130/88 | HR 83 | Temp 97.9°F | Resp 12 | Ht 64.0 in | Wt 258.3 lb

## 2018-03-17 DIAGNOSIS — E559 Vitamin D deficiency, unspecified: Secondary | ICD-10-CM

## 2018-03-17 DIAGNOSIS — Z1212 Encounter for screening for malignant neoplasm of rectum: Secondary | ICD-10-CM

## 2018-03-17 DIAGNOSIS — Z1159 Encounter for screening for other viral diseases: Secondary | ICD-10-CM

## 2018-03-17 DIAGNOSIS — R7303 Prediabetes: Secondary | ICD-10-CM

## 2018-03-17 DIAGNOSIS — F5101 Primary insomnia: Secondary | ICD-10-CM

## 2018-03-17 DIAGNOSIS — E785 Hyperlipidemia, unspecified: Secondary | ICD-10-CM

## 2018-03-17 DIAGNOSIS — G8929 Other chronic pain: Secondary | ICD-10-CM

## 2018-03-17 DIAGNOSIS — I10 Essential (primary) hypertension: Secondary | ICD-10-CM | POA: Diagnosis not present

## 2018-03-17 DIAGNOSIS — M25551 Pain in right hip: Secondary | ICD-10-CM

## 2018-03-17 DIAGNOSIS — Z1211 Encounter for screening for malignant neoplasm of colon: Secondary | ICD-10-CM

## 2018-03-17 DIAGNOSIS — D708 Other neutropenia: Secondary | ICD-10-CM | POA: Diagnosis not present

## 2018-03-17 DIAGNOSIS — F325 Major depressive disorder, single episode, in full remission: Secondary | ICD-10-CM | POA: Insufficient documentation

## 2018-03-17 DIAGNOSIS — M170 Bilateral primary osteoarthritis of knee: Secondary | ICD-10-CM

## 2018-03-17 MED ORDER — TEMAZEPAM 15 MG PO CAPS: 15.0000 mg | ORAL_CAPSULE | Freq: Every day | ORAL | 1 refills | Status: DC

## 2018-03-17 MED ORDER — TELMISARTAN-HCTZ 80-25 MG PO TABS: 1.0000 | ORAL_TABLET | Freq: Every day | ORAL | 1 refills | Status: DC

## 2018-03-17 MED ORDER — AMLODIPINE BESYLATE 5 MG PO TABS: 5.0000 mg | ORAL_TABLET | Freq: Every day | ORAL | 1 refills | Status: DC

## 2018-03-17 NOTE — Progress Notes (Signed)
Established Patient Office Visit  Subjective:  Patient ID: Deanna Perkins, female    DOB: June 17, 1965  Age: 53 y.o. MRN: [id number]  CC:  Chief Complaint  Patient presents with  . Medication Refill    HPI [name] presents for follow up:  Obesity/Prediabetes: Body mass index is 44.34 kg/m. Weight Management History:  Up 3lbs since last visit. Diet: poor meal pattern, frequent dining out and excessive fat intake; she has not been doing well on her diet, wants to start eating more vegetables, drinking more water. Exercise: not active Co-Morbid Conditions: hypertension, hyperlipidemia; 2 or more of these conditions combined with BMI >30 is considered morbid obesity; is this diagnosis appropriate and/or added to patient's problem list? No  Denies polydipsia, polyuria, polyphagia.  HTN:  -does take medications as prescribed - current regimen includes Micardis 80-25mg ; amlodipine . BP's at home have been 120/80's and is at goal today. - taking medications as instructed, no medication side effects noted, no TIAs, no chest pain on exertion, no dyspnea on exertion, no swelling of ankles, no orthostatic dizziness or lightheadedness, no orthopnea or paroxysmal nocturnal dyspnea, no palpitations - DASH diet discussed - pt does not follow a low sodium diet; salt added to cooking and salt shaker on table - The following  are not contributing factors: stress, sedentary lifestyle, corticosteroid use, saturated fats in diet, sweets including black licorice, smoking, sleep apnea, decongestant use.  Vitamin D Deficiency:  Does not want to take supplement due to constipation, she tries to go outside more often to get more vitamin D absorption.  Insomnia: She is taking restoril as needed for insomnia - number of days she needs it throughout the week varies based on her stress and activity levels - usually about 4 times a week. She would like refill of restoril today which we will provide.  White  noise of the fan helps her to stay asleep too.  Stable.  Needs mail order sent today.  Hyperlipidemia: Has not been checked in a few years; would like to check today but she declines lipid panel and other labs at last visit and never followed up.  We will check today.  Hx Depression: Her daughter is home on spring break and this is a stressor.  Otherwise doing well.    Office Visit from 03/17/2018 in [facility]  PHQ-9 Total Score  0     RIGHT Hip Pain, OA of bilateral knees: Has been seeing [facility] for injections and management. States no weakness, no numbness/tingling.  HM: Mammogram is due - we will order today.  Colonoscopy is due - we will place order today.  Past Medical History:  Diagnosis Date  . Back pain   . Cataracts, bilateral   . Excessive or frequent menstruation   . Hyperlipidemia   . Hypertension   . Insomnia   . Obesity   . Osteoarthritis   . Vitamin D deficiency     Past Surgical History:  Procedure Laterality Date  . BREAST SURGERY     reduction  . CERVICAL CERCLAGE    . DILATION AND CURETTAGE OF UTERUS  2008  . EYE SURGERY Bilateral    cataracts  . foot suregry      Family History  Problem Relation Age of Onset  . Diabetes Mother   . Hypertension Mother   . Stroke Mother   . Cancer Father        lung  . Hypertension Father   .  Cancer Maternal Aunt        breast    Social History   Socioeconomic History  . Marital status: Single    Spouse name: Not on file  . Number of children: Not on file  . Years of education: Not on file  . Highest education level: Not on file  Occupational History  . Not on file  Social Needs  . Financial resource strain: Not on file  . Food insecurity:    Worry: Not on file    Inability: Not on file  . Transportation needs:    Medical: Not on file    Non-medical: Not on file  Tobacco Use  . Smoking status: Never Smoker  . Smokeless tobacco: Never Used  Substance and Sexual Activity    . Alcohol use: Yes    Alcohol/week: 0.0 standard drinks    Comment: rarely  . Drug use: No  . Sexual activity: Not Currently    Birth control/protection: I.U.D.  Lifestyle  . Physical activity:    Days per week: Not on file    Minutes per session: Not on file  . Stress: Not on file  Relationships  . Social connections:    Talks on phone: Not on file    Gets together: Not on file    Attends religious service: Not on file    Active member of club or organization: Not on file    Attends meetings of clubs or organizations: Not on file    Relationship status: Not on file  . Intimate partner violence:    Fear of current or ex partner: Not on file    Emotionally abused: Not on file    Physically abused: Not on file    Forced sexual activity: Not on file  Other Topics Concern  . Not on file  Social History Narrative  . Not on file    Outpatient Medications Prior to Visit  Medication Sig Dispense Refill  . amLODipine (NORVASC) 5 MG tablet Take 1 tablet (5 mg total) by mouth daily. for blood pressure 30 tablet 0  . telmisartan-hydrochlorothiazide (MICARDIS HCT) 80-25 MG tablet Take 1 tablet by mouth daily. 30 tablet 0  . temazepam (RESTORIL) 15 MG capsule Take 1 capsule (15 mg total) by mouth at bedtime. 90 capsule 1   No facility-administered medications prior to visit.     Allergies  Allergen Reactions  . Chocolate Hives    ROS Review of Systems  Constitutional: Negative.  Negative for chills, fatigue, fever and unexpected weight change.  HENT: Negative.  Negative for congestion and sore throat.   Respiratory: Negative for cough and shortness of breath.   Cardiovascular: Negative for chest pain, palpitations and leg swelling.  Gastrointestinal: Negative for abdominal pain, constipation, diarrhea, nausea and vomiting.  Endocrine: Negative for polydipsia, polyphagia and polyuria.  Genitourinary: Negative.   Musculoskeletal: Negative.   Skin: Negative.   Neurological:  Negative for light-headedness and headaches.  Hematological: Negative.   Psychiatric/Behavioral: Negative.   All other systems reviewed and are negative.    Objective:    Physical Exam  Constitutional: She is oriented to person, place, and time. She appears well-developed and well-nourished. No distress.  HENT:  Head: Normocephalic and atraumatic.  Right Ear: External ear normal.  Left Ear: External ear normal.  Nose: Nose normal.  Eyes: Conjunctivae and EOM are normal.  Neck: Normal range of motion. Neck supple. No JVD present. No thyromegaly present.  Cardiovascular: Normal rate, regular rhythm and normal heart sounds.  Exam reveals no gallop and no friction rub.  No murmur heard. Pulmonary/Chest: Breath sounds normal. She has no wheezes. She has no rales. She exhibits no tenderness.  Musculoskeletal: Normal range of motion.        General: No tenderness or edema.  Lymphadenopathy:    She has no cervical adenopathy.  Neurological: She is alert and oriented to person, place, and time. No cranial nerve deficit.  Skin: Skin is warm and dry. No rash noted.  Psychiatric: She has a normal mood and affect. Her behavior is normal. Judgment and thought content normal.  Nursing note and vitals reviewed.   BP 130/88   Pulse 83   Temp 97.9 F (36.6 C) (Oral)   Resp 12   Ht  (1.626 m)   Wt 258 lb 4.8 oz (117.2 kg)   SpO2 98%   BMI 44.34 kg/m  Wt Readings from Last 3 Encounters:  03/17/18 258 lb 4.8 oz (117.2 kg)  09/22/17 255 lb 1.6 oz (115.7 kg)  05/09/17 258 lb 11.2 oz (117.3 kg)    Health Maintenance Due  Topic Date Due  . MAMMOGRAM  03/15/2016  . COLONOSCOPY  03/15/2016    There are no preventive care reminders to display for this patient.  Lab Results  Component Value Date   TSH 1.72 11/15/2015   Lab Results  Component Value Date   WBC 3.7 (L) 11/15/2015   HGB 13.9 11/15/2015   HCT 40.9 11/15/2015   MCV 85.2 11/15/2015   PLT 323 11/15/2015   Lab Results   Component Value Date   NA 140 05/09/2017   K 3.2 (L) 05/09/2017   CO2 29 05/09/2017   GLUCOSE 92 05/09/2017   BUN 14 05/09/2017   CREATININE 1.04 05/09/2017   BILITOT 0.8 05/09/2017   ALKPHOS 106 11/15/2015   AST 31 05/09/2017   ALT 42 (H) 05/09/2017   PROT 7.4 05/09/2017   ALBUMIN 4.1 11/15/2015   CALCIUM 9.7 05/09/2017   Lab Results  Component Value Date   CHOL 206 (H) 11/15/2015   Lab Results  Component Value Date   HDL 46 (L) 11/15/2015   Lab Results  Component Value Date   LDLCALC 142 (H) 11/15/2015   Lab Results  Component Value Date   TRIG 88 11/15/2015   Lab Results  Component Value Date   CHOLHDL 4.5 11/15/2015   Lab Results  Component Value Date   HGBA1C 5.5 11/11/2016      Assessment & Plan:   Problem List Items Addressed This Visit      Cardiovascular and Mediastinum   Benign essential HTN    DASH diet discussed      Relevant Medications   amLODipine (NORVASC) 5 MG tablet   telmisartan-hydrochlorothiazide (MICARDIS HCT) 80-25 MG tablet   Other Relevant Orders   COMPLETE METABOLIC PANEL WITH GFR     Musculoskeletal and Integument   Osteoarthritis of both knees     Other   Dyslipidemia    Low fat diet discussed, will check labs today      Relevant Orders   Lipid panel   Insomnia    Doing well on Temazepam. No concerns.      Relevant Medications   temazepam (RESTORIL) 15 MG capsule   Vitamin D deficiency   Relevant Orders   Vitamin D (25 hydroxy)   Obesity, Class III, BMI 40-49.9 (morbid obesity) (HCC)    Discussed importance of 150 minutes of physical activity weekly, eat two servings of fish weekly,  eat one serving of tree nuts ( cashews, pistachios, pecans, almonds.Marland Kitchen) every other day, eat 6 servings of fruit/vegetables daily and drink plenty of water and avoid sweet beverages.       Pre-diabetes   Relevant Orders   Hemoglobin A1c   Morbid obesity (HCC)    Discussed increased risk of complex health disorders.       Other neutropenia (HCC)   Relevant Orders   CBC w/Diff/Platelet   Depression, major, in remission [facility])    Doing well without medication at this time.       Chronic right hip pain    Continue with [facility].       Other Visit Diagnoses    Need for hepatitis C screening test    -  Primary   Relevant Orders   Hepatitis C antibody   Encounter for colorectal cancer screening       Relevant Orders   Ambulatory referral to Gastroenterology      Meds ordered this encounter  Medications  . amLODipine (NORVASC) 5 MG tablet    Sig: Take 1 tablet (5 mg total) by mouth daily. for blood pressure    Dispense:  90 tablet    Refill:  1  . telmisartan-hydrochlorothiazide (MICARDIS HCT) 80-25 MG tablet    Sig: Take 1 tablet by mouth daily.    Dispense:  90 tablet    Refill:  1  . temazepam (RESTORIL) 15 MG capsule    Sig: Take 1 capsule (15 mg total) by mouth at bedtime.    Dispense:  90 capsule    Refill:  1    Follow-up: No follow-ups on file.    Doren Custard, FNP

## 2018-03-17 NOTE — Assessment & Plan Note (Signed)
Low fat diet discussed, will check labs today

## 2018-03-17 NOTE — Assessment & Plan Note (Signed)
Discussed importance of 150 minutes of physical activity weekly, eat two servings of fish weekly, eat one serving of tree nuts ( cashews, pistachios, pecans, almonds..) every other day, eat 6 servings of fruit/vegetables daily and drink plenty of water and avoid sweet beverages. 

## 2018-03-17 NOTE — Assessment & Plan Note (Signed)
- 

## 2018-03-17 NOTE — Assessment & Plan Note (Signed)
Doing well on Temazepam. No concerns.

## 2018-03-17 NOTE — Assessment & Plan Note (Signed)
Doing well without medication at this time.

## 2018-03-17 NOTE — Patient Instructions (Signed)
Please go to have your mammogram performed ASAP.

## 2018-03-17 NOTE — Assessment & Plan Note (Signed)
Continue with [facility].

## 2018-03-17 NOTE — Assessment & Plan Note (Signed)
Discussed increased risk of complex health disorders.

## 2018-03-18 ENCOUNTER — Other Ambulatory Visit: Payer: Self-pay | Admitting: Family Medicine

## 2018-03-18 DIAGNOSIS — E559 Vitamin D deficiency, unspecified: Secondary | ICD-10-CM

## 2018-03-18 LAB — CBC WITH DIFFERENTIAL/PLATELET
Absolute Monocytes: 426 cells/uL (ref 200–950)
Basophils Absolute: 41 cells/uL (ref 0–200)
Basophils Relative: 1 %
Eosinophils Absolute: 98 cells/uL (ref 15–500)
Eosinophils Relative: 2.4 %
HCT: 40.2 % (ref 35.0–45.0)
Hemoglobin: 14.1 g/dL (ref 11.7–15.5)
Lymphs Abs: 1345 cells/uL (ref 850–3900)
MCH: 29.5 pg (ref 27.0–33.0)
MCHC: 35.1 g/dL (ref 32.0–36.0)
MCV: 84.1 fL (ref 80.0–100.0)
MPV: 10.3 fL (ref 7.5–12.5)
Monocytes Relative: 10.4 %
Neutro Abs: 2189 cells/uL (ref 1500–7800)
Neutrophils Relative %: 53.4 %
Platelets: 345 10*3/uL (ref 140–400)
RBC: 4.78 10*6/uL (ref 3.80–5.10)
RDW: 13.5 % (ref 11.0–15.0)
Total Lymphocyte: 32.8 %
WBC: 4.1 10*3/uL (ref 3.8–10.8)

## 2018-03-18 LAB — COMPLETE METABOLIC PANEL WITH GFR
AG Ratio: 1.3 (calc) (ref 1.0–2.5)
ALT: 50 U/L — ABNORMAL HIGH (ref 6–29)
AST: 34 U/L (ref 10–35)
Albumin: 4.1 g/dL (ref 3.6–5.1)
Alkaline phosphatase (APISO): 107 U/L (ref 37–153)
BUN: 11 mg/dL (ref 7–25)
CO2: 29 mmol/L (ref 20–32)
Calcium: 9.8 mg/dL (ref 8.6–10.4)
Chloride: 101 mmol/L (ref 98–110)
Creat: 0.92 mg/dL (ref 0.50–1.05)
GFR, Est African American: 83 mL/min/{1.73_m2} (ref >=60)
GFR, Est Non African American: 72 mL/min/{1.73_m2} (ref >=60)
Globulin: 3.2 g/dL (calc) (ref 1.9–3.7)
Glucose, Bld: 90 mg/dL (ref 65–99)
Potassium: 3.5 mmol/L (ref 3.5–5.3)
Sodium: 139 mmol/L (ref 135–146)
Total Bilirubin: 0.7 mg/dL (ref 0.2–1.2)
Total Protein: 7.3 g/dL (ref 6.1–8.1)

## 2018-03-18 LAB — LIPID PANEL
Cholesterol: 215 mg/dL — ABNORMAL HIGH (ref <200)
HDL: 56 mg/dL (ref >=50)
LDL Cholesterol (Calc): 139 mg/dL (calc) — ABNORMAL HIGH
Non-HDL Cholesterol (Calc): 159 mg/dL (calc) — ABNORMAL HIGH (ref <130)
Total CHOL/HDL Ratio: 3.8 (calc) (ref <5.0)
Triglycerides: 101 mg/dL (ref <150)

## 2018-03-18 LAB — HEMOGLOBIN A1C
Hgb A1c MFr Bld: 5.9 % of total Hgb — ABNORMAL HIGH (ref <5.7)
Mean Plasma Glucose: 123 (calc)
eAG (mmol/L): 6.8 (calc)

## 2018-03-18 LAB — HEPATITIS C ANTIBODY
Hepatitis C Ab: NONREACTIVE
SIGNAL TO CUT-OFF: 0.04 (ref <1.00)

## 2018-03-18 LAB — VITAMIN D 25 HYDROXY (VIT D DEFICIENCY, FRACTURES): Vit D, 25-Hydroxy: 7 ng/mL — ABNORMAL LOW (ref 30–100)

## 2018-03-18 MED ORDER — VITAMIN D (ERGOCALCIFEROL) 1.25 MG (50000 UNIT) PO CAPS: 50000.0000 [IU] | ORAL_CAPSULE | ORAL | 0 refills | Status: DC

## 2018-03-25 ENCOUNTER — Other Ambulatory Visit: Payer: Self-pay | Admitting: Family Medicine

## 2018-03-25 DIAGNOSIS — F5101 Primary insomnia: Secondary | ICD-10-CM

## 2018-04-20 ENCOUNTER — Encounter: Payer: Self-pay | Admitting: *Deleted

## 2018-05-22 DIAGNOSIS — H35351 Cystoid macular degeneration, right eye: Secondary | ICD-10-CM | POA: Diagnosis not present

## 2018-05-29 DIAGNOSIS — H35351 Cystoid macular degeneration, right eye: Secondary | ICD-10-CM | POA: Diagnosis not present

## 2018-06-11 DIAGNOSIS — H209 Unspecified iridocyclitis: Secondary | ICD-10-CM | POA: Diagnosis not present

## 2018-07-08 DIAGNOSIS — H209 Unspecified iridocyclitis: Secondary | ICD-10-CM | POA: Diagnosis not present

## 2018-09-23 ENCOUNTER — Encounter: Payer: Self-pay | Admitting: Family Medicine

## 2018-09-23 ENCOUNTER — Other Ambulatory Visit: Payer: Self-pay

## 2018-09-23 ENCOUNTER — Ambulatory Visit: Payer: BLUE CROSS/BLUE SHIELD | Admitting: Family Medicine

## 2018-09-23 VITALS — BP 118/74 | HR 80 | Temp 97.3°F | Resp 18 | Ht 62.0 in | Wt 259.5 lb

## 2018-09-23 DIAGNOSIS — E785 Hyperlipidemia, unspecified: Secondary | ICD-10-CM

## 2018-09-23 DIAGNOSIS — F5101 Primary insomnia: Secondary | ICD-10-CM | POA: Diagnosis not present

## 2018-09-23 DIAGNOSIS — E559 Vitamin D deficiency, unspecified: Secondary | ICD-10-CM | POA: Diagnosis not present

## 2018-09-23 DIAGNOSIS — Z23 Encounter for immunization: Secondary | ICD-10-CM

## 2018-09-23 DIAGNOSIS — I10 Essential (primary) hypertension: Secondary | ICD-10-CM | POA: Diagnosis not present

## 2018-09-23 DIAGNOSIS — R7303 Prediabetes: Secondary | ICD-10-CM

## 2018-09-23 DIAGNOSIS — F325 Major depressive disorder, single episode, in full remission: Secondary | ICD-10-CM

## 2018-09-23 DIAGNOSIS — D708 Other neutropenia: Secondary | ICD-10-CM

## 2018-09-23 DIAGNOSIS — E538 Deficiency of other specified B group vitamins: Secondary | ICD-10-CM

## 2018-09-23 MED ORDER — AMLODIPINE BESYLATE 5 MG PO TABS: 5.0000 mg | ORAL_TABLET | Freq: Every day | ORAL | 1 refills | Status: DC

## 2018-09-23 MED ORDER — TEMAZEPAM 15 MG PO CAPS: 15.0000 mg | ORAL_CAPSULE | Freq: Every day | ORAL | 1 refills | Status: DC

## 2018-09-23 MED ORDER — VITAMIN D (ERGOCALCIFEROL) 1.25 MG (50000 UNIT) PO CAPS: 50000.0000 [IU] | ORAL_CAPSULE | ORAL | 1 refills | Status: DC

## 2018-09-23 MED ORDER — TELMISARTAN-HCTZ 80-25 MG PO TABS: 1.0000 | ORAL_TABLET | Freq: Every day | ORAL | 1 refills | Status: DC

## 2018-09-23 NOTE — Addendum Note (Signed)
Addended by: Steele Sizer F on: 09/23/2018 01:15 PM   Modules accepted: Orders

## 2018-09-23 NOTE — Progress Notes (Addendum)
Name: Deanna Perkins   MRN: [id number]    DOB: 01-26-1965   Date:09/23/2018       Progress Note  Subjective  Chief Complaint  Chief Complaint  Patient presents with  . Pre-diabetes  . Medication Refill  . Hypertension    Denies any symptoms  . Depression  . Dyslipidemia  . Obesity  . Insomnia    HPI  HTN: taking medication daily and denies side effects of medication. No chest pain, palpitation or SOB. BP is at goal   Insomnia: taking medication prn and it works well for her. No side effects of medication. She has been sleeping at least 7 hours with medication.  She needs refills of Temazepam   Major Depression Mild: she had been off Citalopram for over 3years, she is back went back onCitalopram Feb2017because of increase in stress, raising teenage daughter, and had lost her parents, but she has been off medications for months ago and is doing well. Unchanged, daughter is now going to [employer]   Morbid Obesity: weight has been stable, she is not eating out as often since COVID-19, cutting down on sodas, 2 liter bottle is lasting almost all week.   Pre-diabetes: last A1C was 5.9% previous was 5.7%  she denies polyphagia, polydipsia or polyuria. Family history of DM. She is willing to go see a dietician, also discussed low carb diet meals.   Dyslipidemia: she is eating healthier, reviewed last labs , ASCVD was low   Chronic recurrent right hip pain: she fell twice since August because of the pain, she will contact [facility] so she can go back for a follow up. She takes Aleve prn for pain She had injections in the past. She also had PT that helped in the past   She will re-schedule colonoscopy and mammogram   Patient Active Problem List   Diagnosis Date Noted  . Other neutropenia ([place]) 03/17/2018  . Depression, major, in remission ([place]) 03/17/2018  . Chronic right hip pain 03/17/2018  . Morbid obesity ([place]) 09/22/2017  . Pre-diabetes 11/19/2015  . Obesity,  Class III, BMI 40-49.9 (morbid obesity) ([place]) 09/20/2014  . Back pain, chronic 09/17/2014  . Benign essential HTN 09/17/2014  . History of cataract surgery 09/17/2014  . Dyslipidemia 09/17/2014  . Iridocyclitis 09/17/2014  . Osteoarthritis of both knees 09/17/2014  . History of menorrhagia 09/17/2014  . Insomnia 09/17/2014  . Vitamin D deficiency 09/17/2014    Past Surgical History:  Procedure Laterality Date  . BREAST SURGERY     reduction  . CERVICAL CERCLAGE    . DILATION AND CURETTAGE OF UTERUS  2008  . EYE SURGERY Bilateral    cataracts  . foot suregry      Family History  Problem Relation Age of Onset  . Diabetes Mother   . Hypertension Mother   . Stroke Mother   . Cancer Father        lung  . Hypertension Father   . Cancer Maternal Aunt        breast    Social History   Socioeconomic History  . Marital status: Single    Spouse name: Not on file  . Number of children: 1  . Years of education: Not on file  . Highest education level: Not on file  Occupational History  . Not on file  Social Needs  . Financial resource strain: Not very hard  . Food insecurity    Worry: Never true    Inability: Never true  .  Transportation needs    Medical: No    Non-medical: No  Tobacco Use  . Smoking status: Never Smoker  . Smokeless tobacco: Never Used  Substance and Sexual Activity  . Alcohol use: Yes    Alcohol/week: 0.0 standard drinks    Comment: rarely  . Drug use: No  . Sexual activity: Not Currently    Birth control/protection: I.U.D.  Lifestyle  . Physical activity    Days per week: 0 days    Minutes per session: 0 min  . Stress: Not at all  Relationships  . Social connections    Talks on phone: More than three times a week    Gets together: More than three times a week    Attends religious service: More than 4 times per year    Active member of club or organization: Yes    Attends meetings of clubs or organizations: More than 4 times per year     Relationship status: Divorced  . Intimate partner violence    Fear of current or ex partner: No    Emotionally abused: No    Physically abused: No    Forced sexual activity: No  Other Topics Concern  . Not on file  Social History Narrative   Has 1 daughter, she is 52 years old and [facility] [employer]. Single     Current Outpatient Medications:  .  amLODipine (NORVASC) 5 MG tablet, Take 1 tablet (5 mg total) by mouth daily. for blood pressure, Disp: 90 tablet, Rfl: 1 .  telmisartan-hydrochlorothiazide (MICARDIS HCT) 80-25 MG tablet, Take 1 tablet by mouth daily., Disp: 90 tablet, Rfl: 1 .  temazepam (RESTORIL) 15 MG capsule, Take 1 capsule (15 mg total) by mouth at bedtime., Disp: 90 capsule, Rfl: 1 .  Vitamin D, Ergocalciferol, (DRISDOL) 1.25 MG (50000 UT) CAPS capsule, Take 1 capsule (50,000 Units total) by mouth every 7 (seven) days., Disp: 10 capsule, Rfl: 0  Allergies  Allergen Reactions  . Chocolate Hives    I personally reviewed active problem list, medication list, allergies, family history, social history with the patient/caregiver today.   ROS  Constitutional: Negative for fever or weight change.  Respiratory: Negative for cough and shortness of breath.   Cardiovascular: Negative for chest pain or palpitations.  Gastrointestinal: Negative for abdominal pain, no bowel changes.  Musculoskeletal: positive  for gait problem or joint swelling.  Skin: Negative for rash.  Neurological: Negative for dizziness or headache.  No other specific complaints in a complete review of systems (except as listed in HPI above).  Objective  Vitals:   09/23/18 1150  BP: 118/74  Pulse: 80  Resp: 18  Temp: (!) 97.3 F (36.3 C)  TempSrc: Temporal  SpO2: 99%  Weight: 259 lb 8 oz (117.7 kg)  Height: 5\' 2"  (1.575 m)    Body mass index is 47.46 kg/m.  Physical Exam  Constitutional: Patient appears well-developed and well-nourished. Obese No distress.  HEENT: head  atraumatic, normocephalic, pupils equal and reactive to light Cardiovascular: Normal rate, regular rhythm and normal heart sounds.  No murmur heard. No BLE edema. Pulmonary/Chest: Effort normal and breath sounds normal. No respiratory distress. Abdominal: Soft.  There is no tenderness. Muscular Skeletal: fell twice since August, secondary to pain on right hip  Psychiatric: Patient has a normal mood and affect. behavior is normal. Judgment and thought content normal.  PHQ2/9: [facility] 09/23/2018 03/17/2018 09/22/2017 05/09/2017 05/06/2016  Decreased Interest 0 0 0 0 0  Down,  Depressed, Hopeless 0 0 0 0 0  PHQ - 2 Score 0 0 0 0 0  Altered sleeping 1 0 1 - -  Tired, decreased energy 0 0 0 - -  Change in appetite 1 0 0 - -  Feeling bad or failure about yourself  0 0 0 - -  Trouble concentrating 0 0 0 - -  Moving slowly or fidgety/restless 0 0 0 - -  Suicidal thoughts 0 0 0 - -  PHQ-9 Score 2 0 1 - -  Difficult doing work/chores Not difficult at all Not difficult at all Not difficult at all - -    phq 9 is negative   Fall Risk: Fall Risk  09/23/2018 03/17/2018 09/22/2017 05/09/2017 11/11/2016  Falls in the past year? 1 0 No No Yes  Number falls in past yr: 1 0 - - 1  Injury with Fall? 1 0 - - No  Comment Trouble with her right hip - - - -  Risk for fall due to : Impaired balance/gait - - - -  Follow up - - - - Education provided     Functional Status Survey: Is the patient deaf or have difficulty hearing?: No Does the patient have difficulty seeing, even when wearing glasses/contacts?: Yes Does the patient have difficulty concentrating, remembering, or making decisions?: No Does the patient have difficulty walking or climbing stairs?: Yes Does the patient have difficulty dressing or bathing?: No Does the patient have difficulty doing errands alone such as visiting a doctor's office or shopping?: No   Assessment & Plan  1. Benign essential HTN  - amLODipine (NORVASC) 5  MG tablet; Take 1 tablet (5 mg total) by mouth daily. for blood pressure  Dispense: 90 tablet; Refill: 1 - telmisartan-hydrochlorothiazide (MICARDIS HCT) 80-25 MG tablet; Take 1 tablet by mouth daily.  Dispense: 90 tablet; Refill: 1  2. Primary insomnia  - temazepam (RESTORIL) 15 MG capsule; Take 1 capsule (15 mg total) by mouth at bedtime.  Dispense: 90 capsule; Refill: 1  3. Vitamin D deficiency  - Vitamin D, Ergocalciferol, (DRISDOL) 1.25 MG (50000 UT) CAPS capsule; Take 1 capsule (50,000 Units total) by mouth every 7 (seven) days.  Dispense: 12 capsule; Refill: 1  4. Pre-diabetes  - Amb ref to Medical Nutrition Therapy-MNT  5. Dyslipidemia  - Amb ref to Medical Nutrition Therapy-MNT  6. Morbid obesity (HCC)  - Amb ref to Medical Nutrition Therapy-MNT  7. B12 deficiency  - Amb ref to Medical Nutrition Therapy-MNT.   8. Needs flu shot  She refused   9. Other neutropenia (HCC)  Last lab improved   10. Depression, major, in remission (HCC)  States doing well

## 2018-11-19 ENCOUNTER — Ambulatory Visit: Payer: BLUE CROSS/BLUE SHIELD | Admitting: Dietician

## 2019-01-25 DIAGNOSIS — H35353 Cystoid macular degeneration, bilateral: Secondary | ICD-10-CM | POA: Diagnosis not present

## 2019-03-16 NOTE — Progress Notes (Signed)
Patient ID: [name], female    DOB: [birth year], 54 y.o.   MRN: [id number]  PCP: Deanna Cory, MD  Chief Complaint  Patient presents with  . Hypertension  . Hyperlipidemia  . Pre diabetic    Subjective:   Deanna Perkins is a 54 y.o. female, presents to clinic with CC of the following:  Chief Complaint  Patient presents with  . Hypertension  . Hyperlipidemia  . Pre diabetic    HPI:  Patient is a 54 y.o. female who last saw Dr. Carlynn Perkins 09/23/2018. Last labs 03/2018: CBC was normal Lipid panel - LDL was elevated.  A1C was 5.9% -  prediabetes.  CMP was normal except for her ALT being slightly elevated  Vitamin D was low. Placed on 10 weeks of prescription strength, after that,to take 2000IU once daily.  Presents today for med refill, BP check and follow-up. Patient has no complaints today, notes she has been feeling well.  Obesity/Prediabetes:  Weight Management History:   Has had some success with losing weight, and she notes she has been trying hard to do so with diet modifications more recently. She is limited with exercise due to hip and knee issues.  Has seen [facility] in th past to help manage Wt Readings from Last 3 Encounters:  03/17/19 253 lb 11.2 oz (115.1 kg)  09/23/18 259 lb 8 oz (117.7 kg)  03/17/18 258 lb 4.8 oz (117.2 kg)   Lab Results  Component Value Date   HGBA1C 5.9 (H) 03/17/2018    HTN:  -doestake medications as prescribed - current regimen includes Micardis 80-25mg ; amlodipine 5mg . BP's have been controlled BP Readings from Last 3 Encounters:  03/17/19 124/80  09/23/18 118/74  03/17/18 130/88   -denies recent chest pain, palp's, SOB no swelling of ankles, no dizziness  Insomnia: She is taking restoril as needed for insomnia - number of days she needs it throughout the week varies based on her stress and activity levels,not take every night. She would like refill of restoril   Hyperlipidemia: Last labs reviewed,  discussed the concerns with her elevated cholesterol and LDL cholesterol.  Did note are medications that can be helpful, and and noting her prediabetes concern, do feel would be a good addition to her regimen.  She noted she has had some success losing weight very recently, and would like to try to lose more weight and then have her numbers retested.  If they remain elevated, she then noted she would agree to start the medicine, and a statin was one that I feel would be best to initiate.   Lab Results  Component Value Date   CHOL 215 (H) 03/17/2018   HDL 56 03/17/2018   LDLCALC 139 (H) 03/17/2018   TRIG 101 03/17/2018   CHOLHDL 3.8 03/17/2018    RIGHT Hip Pain, OA of bilateral knees: Had seen [facility] for injections and management in the past. Limits her exercise.  Did note exercise and water can be better tolerated and a good option to trying to get more active.  She noted due to Covid, both her mammogram and her initial colonoscopy for colon cancer screening were delayed last year.  Referral was placed today to have these done and she was in agreement with this.    Patient Active Problem List   Diagnosis Date Noted  . Other neutropenia (HCC) 03/17/2018  . Depression, major, in remission (HCC) 03/17/2018  . Chronic right hip pain 03/17/2018  .  Morbid obesity ([place]) 09/22/2017  . Pre-diabetes 11/19/2015  . Obesity, Class III, BMI 40-49.9 (morbid obesity) ([place]) 09/20/2014  . Back pain, chronic 09/17/2014  . Benign essential HTN 09/17/2014  . History of cataract surgery 09/17/2014  . Dyslipidemia 09/17/2014  . Iridocyclitis 09/17/2014  . Osteoarthritis of both knees 09/17/2014  . History of menorrhagia 09/17/2014  . Insomnia 09/17/2014  . Vitamin D deficiency 09/17/2014      Current Outpatient Medications:  .  amLODipine (NORVASC) 5 MG tablet, Take 1 tablet (5 mg total) by mouth daily. for blood pressure, Disp: 90 tablet, Rfl: 1 .  cholecalciferol (VITAMIN D3) 25 MCG (1000  UNIT) tablet, Take 1,000 Units by mouth daily., Disp: , Rfl:  .  telmisartan-hydrochlorothiazide (MICARDIS HCT) 80-25 MG tablet, Take 1 tablet by mouth daily., Disp: 90 tablet, Rfl: 1 .  temazepam (RESTORIL) 15 MG capsule, Take 1 capsule (15 mg total) by mouth at bedtime., Disp: 90 capsule, Rfl: 1   Allergies  Allergen Reactions  . Chocolate Hives     Past Surgical History:  Procedure Laterality Date  . BREAST SURGERY     reduction  . CERVICAL CERCLAGE    . DILATION AND CURETTAGE OF UTERUS  2008  . EYE SURGERY Bilateral    cataracts  . foot suregry       Family History  Problem Relation Age of Onset  . Diabetes Mother   . Hypertension Mother   . Stroke Mother   . Cancer Father        lung  . Hypertension Father   . Cancer Maternal Aunt        breast     Social History   Tobacco Use  . Smoking status: Never Smoker  . Smokeless tobacco: Never Used  Substance Use Topics  . Alcohol use: Yes    Alcohol/week: 0.0 standard drinks    Comment: rarely    With staff assistance, above reviewed with the patient today.  ROS: As per HPI, denies any change in bowel habits, no abdominal pains, no black stools or blood in the stools.  No recent infectious symptoms of concern with no concerns for Covid.  She was unsure about getting the vaccine, and I strongly encouraged her to do so today.  Otherwise no specific complaints on a limited and focused system review    PHQ2/9: Depression screen [facility] 2/9 03/17/2019 09/23/2018 03/17/2018 09/22/2017 05/09/2017  Decreased Interest 0 0 0 0 0  Down, Depressed, Hopeless 0 0 0 0 0  PHQ - 2 Score 0 0 0 0 0  Altered sleeping 1 1 0 1 -  Tired, decreased energy 0 0 0 0 -  Change in appetite 0 1 0 0 -  Feeling bad or failure about yourself  0 0 0 0 -  Trouble concentrating 0 0 0 0 -  Moving slowly or fidgety/restless 0 0 0 0 -  Suicidal thoughts 0 0 0 0 -  PHQ-9 Score 1 2 0 1 -  Difficult doing work/chores Not difficult at all Not difficult at  all Not difficult at all Not difficult at all -   PHQ-2/9 Result is neg  Fall Risk: Fall Risk  03/17/2019 09/23/2018 03/17/2018 09/22/2017 05/09/2017  Falls in the past year? 1 1 0 No No  Number falls in past yr: 1 1 0 - -  Injury with Fall? 1 1 0 - -  Comment - Trouble with her right hip - - -  Risk for fall due to : -  Impaired balance/gait - - -  Follow up - - - - -      Objective:   Vitals:   03/17/19 1007  BP: 124/80  Pulse: 100  Resp: 20  Temp: (!) 96.9 F (36.1 C)  TempSrc: Temporal  SpO2: 98%  Weight: 253 lb 11.2 oz (115.1 kg)  Height: 5\' 4"  (1.626 m)    Body mass index is 43.55 kg/m.  Physical Exam   NAD, masked, obese, pleasant HEENT - Experiment/AT, sclera anicteric, conj - non-inj'ed,  Neck - supple, no JVD, carotids 2+ and = without bruits bilat Car - RRR without m/g/r Pulm- RR and effort normal at rest, CTA without wheeze or rales Abd - soft, obese, NT,  Skin- no rash noted on exposed areas,  Ext - no LE edema,  Neuro/psychiatric - affect was not flat, appropriate with conversation  Alert and oriented  Grossly non-focal   Speech normal   Results for orders placed or performed in visit on 03/17/18  Lipid panel  Result Value Ref Range   Cholesterol 215 (H) <200 mg/dL   HDL 56 > OR = 50 mg/dL   Triglycerides 05/17/18 174 mg/dL   LDL Cholesterol (Calc) 139 (H) mg/dL (calc)   Total CHOL/HDL Ratio 3.8 <5.0 (calc)   Non-HDL Cholesterol (Calc) 159 (H) <130 mg/dL (calc)  Hepatitis C antibody  Result Value Ref Range   Hepatitis C Ab NON-REACTIVE NON-REACTI   SIGNAL TO CUT-OFF 0.04 <1.00  Hemoglobin A1c  Result Value Ref Range   Hgb A1c MFr Bld 5.9 (H) <5.7 % of total Hgb   Mean Plasma Glucose 123 (calc)   eAG (mmol/L) 6.8 (calc)  COMPLETE METABOLIC PANEL WITH GFR  Result Value Ref Range   Glucose, Bld 90 65 - 99 mg/dL   BUN 11 7 - 25 mg/dL   Creat <944 9.67 - 5.91 mg/dL   GFR, Est Non African American 72 > OR = 60 mL/min/1.20m2   GFR, Est African American 83 >  OR = 60 mL/min/1.15m2   BUN/Creatinine Ratio NOT APPLICABLE 6 - 22 (calc)   Sodium 139 135 - 146 mmol/L   Potassium 3.5 3.5 - 5.3 mmol/L   Chloride 101 98 - 110 mmol/L   CO2 29 20 - 32 mmol/L   Calcium 9.8 8.6 - 10.4 mg/dL   Total Protein 7.3 6.1 - 8.1 g/dL   Albumin 4.1 3.6 - 5.1 g/dL   Globulin 3.2 1.9 - 3.7 g/dL (calc)   AG Ratio 1.3 1.0 - 2.5 (calc)   Total Bilirubin 0.7 0.2 - 1.2 mg/dL   Alkaline phosphatase (APISO) 107 37 - 153 U/L   AST 34 10 - 35 U/L   ALT 50 (H) 6 - 29 U/L  Vitamin D (25 hydroxy)  Result Value Ref Range   Vit D, 25-Hydroxy 7 (L) 30 - 100 ng/mL  CBC w/Diff/Platelet  Result Value Ref Range   WBC 4.1 3.8 - 10.8 Thousand/uL   RBC 4.78 3.80 - 5.10 Million/uL   Hemoglobin 14.1 11.7 - 15.5 g/dL   HCT 75m 46.6 - 59.9 %   MCV 84.1 80.0 - 100.0 fL   MCH 29.5 27.0 - 33.0 pg   MCHC 35.1 32.0 - 36.0 g/dL   RDW 35.7 01.7 - 79.3 %   Platelets 345 140 - 400 Thousand/uL   MPV 10.3 7.5 - 12.5 fL   Neutro Abs 2,189 1,500 - 7,800 cells/uL   Lymphs Abs 1,345 850 - 3,900 cells/uL   Absolute Monocytes 426 200 -  950 cells/uL   Eosinophils Absolute 98 15 - 500 cells/uL   Basophils Absolute 41 0 - 200 cells/uL   Neutrophils Relative % 53.4 %   Total Lymphocyte 32.8 %   Monocytes Relative 10.4 %   Eosinophils Relative 2.4 %   Basophils Relative 1.0 %       Assessment & Plan:     1. Benign essential HTN Blood pressures remain well controlled on her medication regimen.  Doing a better job with diet modifications, and strongly encouraged continuing attempts at weight loss. Medications refilled today - amLODipine (NORVASC) 5 MG tablet; Take 1 tablet (5 mg total) by mouth daily. for blood pressure  Dispense: 90 tablet; Refill: 1 - telmisartan-hydrochlorothiazide (MICARDIS HCT) 80-25 MG tablet; Take 1 tablet by mouth daily.  Dispense: 90 tablet; Refill: 1  2. Primary insomnia She notes she does not take the Restoril every night, and does help when she takes it.   Encouraged to try to lessen use over time, with the goal of not needing this to help with sleep in the future. Okay to renew the medicine presently. - temazepam (RESTORIL) 15 MG capsule; Take 1 capsule (15 mg total) by mouth at bedtime.  Dispense: 90 capsule; Refill: 1  3. Encounter for screening mammogram for malignant neoplasm of breast Was delayed more recently due to Covid, and re-ordered today. - MM 3D SCREEN BREAST BILATERAL; Future  4. Screening for colorectal cancer Has been delayed more recently due to Covid, and she does agree to pursue, and ordered.  Briefly discussed the prep needed to be completed prior, and she agreed to moving forward with this.  Referral was ordered. - Ambulatory referral to Gastroenterology  5. Dyslipidemia Reviewed the labs from a year ago, and discussed the concerns with the higher LDL cholesterol in addition to the total cholesterol.  I felt a statin would be helpful to add to her regimen.  She wanted to try to lose weight further to help manage without the addition of a medication.  Agreed to give a little more time, and then will recheck the labs, at the latest in 3 months, and if the lipid numbers have not improved or worsened at all, she was in agreement to potentially starting a statin at that point. Strongly encourage continuing the diet modifications to help with the weight loss.  6. Vitamin D deficiency We will recheck vitamin D level with the labs planned in about 3 months.  7. Pre-diabetes Noted the importance today of diet modifications, and weight loss to help with this, I do need follow-up labs to check her status.  Agreed to recheck labs again in about 3 months, giving her a little more time with strong encouragement to continue to try to lose weight and see how her labs are at that point.  8. Morbid obesity (HCC) As above, some recent success with weight loss, and encouraged that to continue.  Agreed to wait 3 months, and give her some more  time to continue to try to lose weight and strongly encouraged, and then recheck the lab tests as noted above to reassess.  Follow-up sooner as needed.  Also strongly encouraged her to get the Covid vaccine when available.  She is still somewhat hesitant with pursuing this presently.   Deanna Haring, MD 03/17/19 10:20 AM

## 2019-03-17 ENCOUNTER — Encounter: Payer: Self-pay | Admitting: Internal Medicine

## 2019-03-17 ENCOUNTER — Ambulatory Visit: Payer: BC Managed Care – PPO | Admitting: Internal Medicine

## 2019-03-17 ENCOUNTER — Other Ambulatory Visit: Payer: Self-pay

## 2019-03-17 VITALS — BP 124/80 | HR 100 | Temp 96.9°F | Resp 20 | Ht 64.0 in | Wt 253.7 lb

## 2019-03-17 DIAGNOSIS — Z1212 Encounter for screening for malignant neoplasm of rectum: Secondary | ICD-10-CM

## 2019-03-17 DIAGNOSIS — Z1231 Encounter for screening mammogram for malignant neoplasm of breast: Secondary | ICD-10-CM

## 2019-03-17 DIAGNOSIS — I10 Essential (primary) hypertension: Secondary | ICD-10-CM

## 2019-03-17 DIAGNOSIS — Z1211 Encounter for screening for malignant neoplasm of colon: Secondary | ICD-10-CM

## 2019-03-17 DIAGNOSIS — R7303 Prediabetes: Secondary | ICD-10-CM

## 2019-03-17 DIAGNOSIS — F5101 Primary insomnia: Secondary | ICD-10-CM | POA: Diagnosis not present

## 2019-03-17 DIAGNOSIS — E785 Hyperlipidemia, unspecified: Secondary | ICD-10-CM

## 2019-03-17 DIAGNOSIS — E559 Vitamin D deficiency, unspecified: Secondary | ICD-10-CM

## 2019-03-17 MED ORDER — AMLODIPINE BESYLATE 5 MG PO TABS: 5.0000 mg | ORAL_TABLET | Freq: Every day | ORAL | 1 refills | Status: DC

## 2019-03-17 MED ORDER — TELMISARTAN-HCTZ 80-25 MG PO TABS: 1.0000 | ORAL_TABLET | Freq: Every day | ORAL | 1 refills | Status: DC

## 2019-03-17 MED ORDER — TEMAZEPAM 15 MG PO CAPS: 15.0000 mg | ORAL_CAPSULE | Freq: Every day | ORAL | 1 refills | Status: DC

## 2019-03-17 NOTE — Patient Instructions (Signed)
I strongly encourage you to get the Covid vaccination, when available.

## 2019-03-18 ENCOUNTER — Other Ambulatory Visit: Payer: Self-pay | Admitting: Family Medicine

## 2019-03-18 DIAGNOSIS — I10 Essential (primary) hypertension: Secondary | ICD-10-CM

## 2019-03-18 DIAGNOSIS — F5101 Primary insomnia: Secondary | ICD-10-CM

## 2019-03-18 NOTE — Telephone Encounter (Signed)
Requested  medications are  due for refill today yes  Requested medications are on the active medication list yes  Last refill 12/24  Last office visit 9/16  Future visit scheduled no  Notes to clinic no delegated

## 2019-03-19 NOTE — Telephone Encounter (Signed)
Lvm to sch appt °

## 2019-07-01 DIAGNOSIS — H209 Unspecified iridocyclitis: Secondary | ICD-10-CM | POA: Diagnosis not present

## 2019-07-01 DIAGNOSIS — H35032 Hypertensive retinopathy, left eye: Secondary | ICD-10-CM | POA: Diagnosis not present

## 2019-07-09 DIAGNOSIS — H35032 Hypertensive retinopathy, left eye: Secondary | ICD-10-CM | POA: Diagnosis not present

## 2019-07-15 DIAGNOSIS — S39012A Strain of muscle, fascia and tendon of lower back, initial encounter: Secondary | ICD-10-CM | POA: Diagnosis not present

## 2019-07-15 DIAGNOSIS — M13862 Other specified arthritis, left knee: Secondary | ICD-10-CM | POA: Diagnosis not present

## 2019-08-03 DIAGNOSIS — M7061 Trochanteric bursitis, right hip: Secondary | ICD-10-CM | POA: Diagnosis not present

## 2019-08-18 DIAGNOSIS — M533 Sacrococcygeal disorders, not elsewhere classified: Secondary | ICD-10-CM | POA: Diagnosis not present

## 2019-09-01 ENCOUNTER — Other Ambulatory Visit: Payer: Self-pay | Admitting: Family Medicine

## 2019-09-01 DIAGNOSIS — I10 Essential (primary) hypertension: Secondary | ICD-10-CM

## 2019-09-01 DIAGNOSIS — F5101 Primary insomnia: Secondary | ICD-10-CM

## 2019-09-01 NOTE — Telephone Encounter (Signed)
Requested medication (s) are due for refill today: yes   Requested medication (s) are on the active medication list: yes  Last refill:  03/18/2019  Future visit scheduled: no  Notes to clinic:  vm left for patient to callback to schedule office visit Overdue for follow up    Requested Prescriptions  Pending Prescriptions Disp Refills   telmisartan-hydrochlorothiazide (MICARDIS HCT) 80-25 MG tablet 30 tablet 0    Sig: TAKE 1 TABLET BY MOUTH DAILY GENERIC EQUIVALENT FOR MICARDIS HCT.      Cardiovascular: ARB + Diuretic Combos Failed - 09/01/2019 12:59 PM      Failed - K in normal range and within 180 days    Potassium  Date Value Ref Range Status  03/17/2018 3.5 3.5 - 5.3 mmol/L Final          Failed - Na in normal range and within 180 days    Sodium  Date Value Ref Range Status  03/17/2018 139 135 - 146 mmol/L Final          Failed - Cr in normal range and within 180 days    Creat  Date Value Ref Range Status  03/17/2018 0.92 0.50 - 1.05 mg/dL Final    Comment:    For patients >32 years of age, the reference limit for Creatinine is approximately 13% higher for people identified as African-American. .           Failed - Ca in normal range and within 180 days    Calcium  Date Value Ref Range Status  03/17/2018 9.8 8.6 - 10.4 mg/dL Final          Passed - Patient is not pregnant      Passed - Last BP in normal range    BP Readings from Last 1 Encounters:  03/17/19 124/80          Passed - Valid encounter within last 6 months    Recent Outpatient Visits           5 months ago Encounter for screening mammogram for malignant neoplasm of breast   [facility] Jamelle Haring, MD   11 months ago Pre-diabetes   [facility] Alba Cory, MD   1 year ago Need for hepatitis C screening test   [facility] Doren Custard, FNP   1 year ago Vitamin D deficiency   [facility], Gerome Apley, FNP   2 years ago Vitamin D deficiency   [facility] [place], Lanora Manis E, NP                amLODipine (NORVASC) 5 MG tablet 30 tablet 0    Sig: Take 1 tablet (5 mg total) by mouth daily. for blood pressure      Cardiovascular:  Calcium Channel Blockers Passed - 09/01/2019 12:59 PM      Passed - Last BP in normal range    BP Readings from Last 1 Encounters:  03/17/19 124/80          Passed - Valid encounter within last 6 months    Recent Outpatient Visits           5 months ago Encounter for screening mammogram for malignant neoplasm of breast   [facility] Jamelle Haring, MD   11 months ago Pre-diabetes   [facility] Alba Cory, MD   1 year ago Need for hepatitis C screening test  [facility] Doren Custard, FNP   1 year ago Vitamin D deficiency   [facility], Gerome Apley, FNP   2 years ago Vitamin D deficiency   [facility] [place], Percell Belt, NP                temazepam (RESTORIL) 15 MG capsule 90 capsule 1    Sig: Take 1 capsule (15 mg total) by mouth at bedtime.      Not Delegated - Psychiatry:  Anxiolytics/Hypnotics Failed - 09/01/2019 12:59 PM      Failed - This refill cannot be delegated      Failed - Urine Drug Screen completed in last 360 days.      Passed - Valid encounter within last 6 months    Recent Outpatient Visits           5 months ago Encounter for screening mammogram for malignant neoplasm of breast   [facility] Jamelle Haring, MD   11 months ago Pre-diabetes   [facility] Alba Cory, MD   1 year ago Need for hepatitis C screening test   [facility] Doren Custard, FNP   1 year ago Vitamin D deficiency   [facility] Doren Custard, FNP   2 years ago Vitamin D deficiency   [facility] [place], Percell Belt, NP

## 2019-09-01 NOTE — Telephone Encounter (Signed)
Medication Refill - Medication: amLODipine (NORVASC) 5 MG tablet ,telmisartan-hydrochlorothiazide (MICARDIS HCT) 80-25 MG tablet,temazepam (RESTORIL) 15 MG capsule  Has the patient contacted their pharmacy? yes (Agent: If no, request that the patient contact the pharmacy for the refill.) (Agent: If yes, when and what did the pharmacy advise?)Contact PCP  Preferred Pharmacy (with phone number or street name):  [id number] [place], [place] - [address] Phone:  [phone]  Fax:  [phone]       Agent: Please be advised that RX refills may take up to 3 business days. We ask that you follow-up with your pharmacy.

## 2019-09-10 ENCOUNTER — Other Ambulatory Visit: Payer: Self-pay | Admitting: Family Medicine

## 2019-09-10 DIAGNOSIS — F5101 Primary insomnia: Secondary | ICD-10-CM

## 2019-09-10 DIAGNOSIS — I10 Essential (primary) hypertension: Secondary | ICD-10-CM

## 2019-09-10 MED ORDER — AMLODIPINE BESYLATE 5 MG PO TABS: 5.0000 mg | ORAL_TABLET | Freq: Every day | ORAL | 0 refills | Status: DC

## 2019-09-10 MED ORDER — TEMAZEPAM 15 MG PO CAPS: 15.0000 mg | ORAL_CAPSULE | Freq: Every day | ORAL | 1 refills | Status: DC

## 2019-09-10 MED ORDER — TELMISARTAN-HCTZ 80-25 MG PO TABS: ORAL_TABLET | ORAL | 0 refills | Status: DC

## 2019-09-10 NOTE — Telephone Encounter (Signed)
Medication: amLODipine (NORVASC) 5 MG tablet [[id number]], telmisartan-hydrochlorothiazide (MICARDIS HCT) 80-25 MG tablet [[id number]] , temazepam (RESTORIL) 15 MG capsule [[id number]] - apt 09/15/19  Has the patient contacted their pharmacy? YES  (Agent: If no, request that the patient contact the pharmacy for the refill.) (Agent: If yes, when and what did the pharmacy advise?)  Preferred Pharmacy (with phone number or street name): [facility] [address], [place] - [address] [place] [address] Phone: [phone] Fax: [phone] Hours: Not open 24 hours    Agent: Please be advised that RX refills may take up to 3 business days. We ask that you follow-up with your pharmacy.

## 2019-09-14 DIAGNOSIS — M7061 Trochanteric bursitis, right hip: Secondary | ICD-10-CM | POA: Diagnosis not present

## 2019-09-14 DIAGNOSIS — M461 Sacroiliitis, not elsewhere classified: Secondary | ICD-10-CM | POA: Diagnosis not present

## 2019-09-14 NOTE — Progress Notes (Signed)
Name: Deanna Perkins   MRN: [id number]    DOB: March 10, 1965   Date:09/15/2019       Progress Note  Subjective  Chief Complaint  Chief Complaint  Patient presents with  . Prediabetes  . Hypertension  . Dyslipidemia  . Depression  . Obesity    HPI       HTN: taking medication daily and denies side effects of medication. No chest pain, palpitation or SOB. BP is at goal , continue current regiment   Insomnia: taking medication prn and it works well for her. No side effects of medication. She has been sleeping at least 7 hours with medication, she takes medication prn only   Major Depression in Remission she was very depressed from 2008  until 2017. She took Citlaopram for years but weaned self off a few years ago. She was having inability to sleep, gained weight, fatigue, she had lack of motivation but still able to work. She states she did not care about anything. Doing well now, in remission and has no concerns at this time  Morbid Obesity: BMI still above 40, she drinks sodas less often and only about 1 cup a day , instead of 2 liters per day, also cutting down on portion size, and drinking more water   Pre-diabetes: last A1C was 5.9% previous was 5.7%  she denies polyphagia, polydipsia or polyuria. Family history of DM. She did not see dietician, but she has been cutting down on portion size and has been walking at least 2-3 times a week for 30 minutes and has lost 12 lbs since last visit.   Dyslipidemia: sheis eating healthier, reviewed last labs , ASCVD was low , we will recheck labs and decide on statin therapy   Chronic recurrent right hip pain: went to see Dr. Odis Luster again yesterday, she states she will resume PT, she has steroid injections on her right hip and lumbar spine. Pain today is described as aching 2/10 - she knows it is about to rain today   She will re-schedule colonoscopy and mammogram    Patient Active Problem List   Diagnosis Date Noted  . Other neutropenia  (HCC) 03/17/2018  . Depression, major, in remission (HCC) 03/17/2018  . Chronic right hip pain 03/17/2018  . Morbid obesity (HCC) 09/22/2017  . Pre-diabetes 11/19/2015  . Obesity, Class III, BMI 40-49.9 (morbid obesity) (HCC) 09/20/2014  . Back pain, chronic 09/17/2014  . Benign essential HTN 09/17/2014  . History of cataract surgery 09/17/2014  . Dyslipidemia 09/17/2014  . Iridocyclitis 09/17/2014  . Osteoarthritis of both knees 09/17/2014  . History of menorrhagia 09/17/2014  . Insomnia 09/17/2014  . Vitamin D deficiency 09/17/2014    Past Surgical History:  Procedure Laterality Date  . BREAST SURGERY     reduction  . CERVICAL CERCLAGE    . DILATION AND CURETTAGE OF UTERUS  2008  . EYE SURGERY Bilateral    cataracts  . foot suregry      Family History  Problem Relation Age of Onset  . Diabetes Mother   . Hypertension Mother   . Stroke Mother   . Cancer Father        lung  . Hypertension Father   . Cancer Maternal Aunt        breast    Social History   Tobacco Use  . Smoking status: Never Smoker  . Smokeless tobacco: Never Used  Substance Use Topics  . Alcohol use: Yes    Alcohol/week: 0.0 standard  drinks    Comment: rarely     Current Outpatient Medications:  .  amLODipine (NORVASC) 5 MG tablet, Take 1 tablet (5 mg total) by mouth daily. for blood pressure, Disp: 10 tablet, Rfl: 0 .  cholecalciferol (VITAMIN D3) 25 MCG (1000 UNIT) tablet, Take 1,000 Units by mouth daily., Disp: , Rfl:  .  prednisoLONE acetate (PRED FORTE) 1 % ophthalmic suspension, prednisolone acetate 1 % eye drops,suspension  INSTILL 1 DROP INTO BOTH EYES 3 TIMES A DAY AND SLOWLY TAPER, Disp: , Rfl:  .  telmisartan-hydrochlorothiazide (MICARDIS HCT) 80-25 MG tablet, TAKE 1 TABLET BY MOUTH DAILY GENERIC EQUIVALENT FOR MICARDIS HCT., Disp: 10 tablet, Rfl: 0 .  temazepam (RESTORIL) 15 MG capsule, Take 1 capsule (15 mg total) by mouth at bedtime., Disp: 90 capsule, Rfl: 1  Allergies   Allergen Reactions  . Chocolate Hives    I personally reviewed active problem list, medication list, allergies, family history, social history, health maintenance with the patient/caregiver today.   ROS  Constitutional: Negative for fever or weight change.  Respiratory: Negative for cough and shortness of breath.   Cardiovascular: Negative for chest pain or palpitations.  Gastrointestinal: Negative for abdominal pain, no bowel changes.  Musculoskeletal: positive  for gait problem but no  joint swelling.  Skin: Negative for rash.  Neurological: Negative for dizziness or headache.  No other specific complaints in a complete review of systems (except as listed in HPI above).  Objective  Vitals:   09/15/19 0947  BP: 130/80  Pulse: 83  Resp: 16  Temp: 98 F (36.7 C)  TempSrc: Oral  SpO2: 99%  Weight: 242 lb 1.6 oz (109.8 kg)  Height: 5\' 4"  (1.626 m)    Body mass index is 41.56 kg/m.  Physical Exam  Constitutional: Patient appears well-developed and well-nourished. Obese  No distress.  HEENT: head atraumatic, normocephalic, pupils equal and reactive to light,  neck supple, throat within normal limits Cardiovascular: Normal rate, regular rhythm and normal heart sounds.  No murmur heard. No BLE edema. Pulmonary/Chest: Effort normal and breath sounds normal. No respiratory distress. Abdominal: Soft.  There is no tenderness. Psychiatric: Patient has a normal mood and affect. behavior is normal. Judgment and thought content normal.  PHQ2/9: Depression screen [facility] 2/9 09/15/2019 03/17/2019 09/23/2018 03/17/2018 09/22/2017  Decreased Interest 0 0 0 0 0  Down, Depressed, Hopeless 0 0 0 0 0  PHQ - 2 Score 0 0 0 0 0  Altered sleeping - 1 1 0 1  Tired, decreased energy - 0 0 0 0  Change in appetite - 0 1 0 0  Feeling bad or failure about yourself  - 0 0 0 0  Trouble concentrating - 0 0 0 0  Moving slowly or fidgety/restless - 0 0 0 0  Suicidal thoughts - 0 0 0 0  PHQ-9 Score - 1 2  0 1  Difficult doing work/chores - Not difficult at all Not difficult at all Not difficult at all Not difficult at all    phq 9 is negative   Fall Risk: Fall Risk  09/15/2019 03/17/2019 09/23/2018 03/17/2018 09/22/2017  Falls in the past year? 0 1 1 0 No  Number falls in past yr: 0 1 1 0 -  Injury with Fall? 0 1 1 0 -  Comment - - Trouble with her right hip - -  Risk for fall due to : - - Impaired balance/gait - -  Follow up - - - - -  Functional Status Survey: Is the patient deaf or have difficulty hearing?: No Does the patient have difficulty seeing, even when wearing glasses/contacts?: No Does the patient have difficulty concentrating, remembering, or making decisions?: No Does the patient have difficulty walking or climbing stairs?: Yes Does the patient have difficulty dressing or bathing?: No Does the patient have difficulty doing errands alone such as visiting a doctor's office or shopping?: No    Assessment & Plan  1. Benign essential HTN  - COMPLETE METABOLIC PANEL WITH GFR - CBC with Differential/Platelet - amLODipine (NORVASC) 5 MG tablet; Take 1 tablet (5 mg total) by mouth daily. for blood pressure  Dispense: 90 tablet; Refill: 1 - telmisartan-hydrochlorothiazide (MICARDIS HCT) 80-25 MG tablet; TAKE 1 TABLET BY MOUTH DAILY GENERIC EQUIVALENT FOR MICARDIS HCT.  Dispense: 90 tablet; Refill: 1  2. Primary insomnia  - temazepam (RESTORIL) 15 MG capsule; Take 1 capsule (15 mg total) by mouth at bedtime.  Dispense: 90 capsule; Refill: 0  3. Dyslipidemia  - Lipid panel  4. Vitamin D deficiency  - VITAMIN D 25 Hydroxy (Vit-D Deficiency, Fractures)  5. B12 deficiency  - Vitamin B12  6. Morbid obesity (HCC)  Discussed with the patient the risk posed by an increased BMI. Discussed importance of portion control, calorie counting and at least 150 minutes of physical activity weekly. Avoid sweet beverages and drink more water. Eat at least 6 servings of fruit and  vegetables daily   7. Pre-diabetes  - Hemoglobin A1c  8. Depression, major, in remission (HCC)   9. Chronic right hip pain  Seen by Ortho and stable   10. Primary osteoarthritis of both knees   11. Screening for colorectal cancer

## 2019-09-15 ENCOUNTER — Other Ambulatory Visit: Payer: Self-pay

## 2019-09-15 ENCOUNTER — Ambulatory Visit: Payer: BC Managed Care – PPO | Admitting: Family Medicine

## 2019-09-15 ENCOUNTER — Encounter: Payer: Self-pay | Admitting: Family Medicine

## 2019-09-15 DIAGNOSIS — F5101 Primary insomnia: Secondary | ICD-10-CM | POA: Diagnosis not present

## 2019-09-15 DIAGNOSIS — E559 Vitamin D deficiency, unspecified: Secondary | ICD-10-CM | POA: Diagnosis not present

## 2019-09-15 DIAGNOSIS — Z1212 Encounter for screening for malignant neoplasm of rectum: Secondary | ICD-10-CM

## 2019-09-15 DIAGNOSIS — E785 Hyperlipidemia, unspecified: Secondary | ICD-10-CM

## 2019-09-15 DIAGNOSIS — R7303 Prediabetes: Secondary | ICD-10-CM | POA: Diagnosis not present

## 2019-09-15 DIAGNOSIS — E538 Deficiency of other specified B group vitamins: Secondary | ICD-10-CM

## 2019-09-15 DIAGNOSIS — M25551 Pain in right hip: Secondary | ICD-10-CM

## 2019-09-15 DIAGNOSIS — G8929 Other chronic pain: Secondary | ICD-10-CM

## 2019-09-15 DIAGNOSIS — Z1211 Encounter for screening for malignant neoplasm of colon: Secondary | ICD-10-CM

## 2019-09-15 DIAGNOSIS — I10 Essential (primary) hypertension: Secondary | ICD-10-CM

## 2019-09-15 DIAGNOSIS — M170 Bilateral primary osteoarthritis of knee: Secondary | ICD-10-CM

## 2019-09-15 DIAGNOSIS — F325 Major depressive disorder, single episode, in full remission: Secondary | ICD-10-CM

## 2019-09-15 MED ORDER — TELMISARTAN-HCTZ 80-25 MG PO TABS: ORAL_TABLET | ORAL | 1 refills | Status: DC

## 2019-09-15 MED ORDER — AMLODIPINE BESYLATE 5 MG PO TABS: 5.0000 mg | ORAL_TABLET | Freq: Every day | ORAL | 1 refills | Status: DC

## 2019-09-15 MED ORDER — TEMAZEPAM 15 MG PO CAPS: 15.0000 mg | ORAL_CAPSULE | Freq: Every day | ORAL | 0 refills | Status: DC

## 2019-09-15 NOTE — Patient Instructions (Signed)
Please call [facility] for colonoscopy

## 2019-09-15 NOTE — Addendum Note (Signed)
Addended by: Alba Cory F on: 09/15/2019 10:19 AM   Modules accepted: Orders

## 2019-09-16 LAB — COMPLETE METABOLIC PANEL WITH GFR
AG Ratio: 1.3 (calc) (ref 1.0–2.5)
ALT: 23 U/L (ref 6–29)
AST: 19 U/L (ref 10–35)
Albumin: 4 g/dL (ref 3.6–5.1)
Alkaline phosphatase (APISO): 106 U/L (ref 37–153)
BUN: 12 mg/dL (ref 7–25)
CO2: 32 mmol/L (ref 20–32)
Calcium: 9.7 mg/dL (ref 8.6–10.4)
Chloride: 103 mmol/L (ref 98–110)
Creat: 1.04 mg/dL (ref 0.50–1.05)
GFR, Est African American: 71 mL/min/{1.73_m2} (ref >=60)
GFR, Est Non African American: 61 mL/min/{1.73_m2} (ref >=60)
Globulin: 3.1 g/dL (calc) (ref 1.9–3.7)
Glucose, Bld: 83 mg/dL (ref 65–99)
Potassium: 3.5 mmol/L (ref 3.5–5.3)
Sodium: 141 mmol/L (ref 135–146)
Total Bilirubin: 0.8 mg/dL (ref 0.2–1.2)
Total Protein: 7.1 g/dL (ref 6.1–8.1)

## 2019-09-16 LAB — LIPID PANEL
Cholesterol: 235 mg/dL — ABNORMAL HIGH (ref <200)
HDL: 73 mg/dL (ref >=50)
LDL Cholesterol (Calc): 144 mg/dL (calc) — ABNORMAL HIGH
Non-HDL Cholesterol (Calc): 162 mg/dL (calc) — ABNORMAL HIGH (ref <130)
Total CHOL/HDL Ratio: 3.2 (calc) (ref <5.0)
Triglycerides: 81 mg/dL (ref <150)

## 2019-09-16 LAB — CBC WITH DIFFERENTIAL/PLATELET
Absolute Monocytes: 340 cells/uL (ref 200–950)
Basophils Absolute: 41 cells/uL (ref 0–200)
Basophils Relative: 1.1 %
Eosinophils Absolute: 70 cells/uL (ref 15–500)
Eosinophils Relative: 1.9 %
HCT: 41.4 % (ref 35.0–45.0)
Hemoglobin: 14.4 g/dL (ref 11.7–15.5)
Lymphs Abs: 1136 cells/uL (ref 850–3900)
MCH: 29.9 pg (ref 27.0–33.0)
MCHC: 34.8 g/dL (ref 32.0–36.0)
MCV: 85.9 fL (ref 80.0–100.0)
MPV: 10.4 fL (ref 7.5–12.5)
Monocytes Relative: 9.2 %
Neutro Abs: 2113 cells/uL (ref 1500–7800)
Neutrophils Relative %: 57.1 %
Platelets: 309 10*3/uL (ref 140–400)
RBC: 4.82 10*6/uL (ref 3.80–5.10)
RDW: 13.7 % (ref 11.0–15.0)
Total Lymphocyte: 30.7 %
WBC: 3.7 10*3/uL — ABNORMAL LOW (ref 3.8–10.8)

## 2019-09-16 LAB — HEMOGLOBIN A1C
Hgb A1c MFr Bld: 5.7 % of total Hgb — ABNORMAL HIGH (ref <5.7)
Mean Plasma Glucose: 117 (calc)
eAG (mmol/L): 6.5 (calc)

## 2019-09-16 LAB — VITAMIN B12: Vitamin B-12: 352 pg/mL (ref 200–1100)

## 2019-09-16 LAB — VITAMIN D 25 HYDROXY (VIT D DEFICIENCY, FRACTURES): Vit D, 25-Hydroxy: 8 ng/mL — ABNORMAL LOW (ref 30–100)

## 2019-09-20 ENCOUNTER — Other Ambulatory Visit: Payer: Self-pay | Admitting: Family Medicine

## 2019-09-20 DIAGNOSIS — E559 Vitamin D deficiency, unspecified: Secondary | ICD-10-CM

## 2019-09-20 MED ORDER — VITAMIN D (ERGOCALCIFEROL) 1.25 MG (50000 UNIT) PO CAPS: 50000.0000 [IU] | ORAL_CAPSULE | ORAL | 1 refills | Status: DC

## 2019-09-23 DIAGNOSIS — M7061 Trochanteric bursitis, right hip: Secondary | ICD-10-CM | POA: Diagnosis not present

## 2019-09-23 DIAGNOSIS — M25551 Pain in right hip: Secondary | ICD-10-CM | POA: Diagnosis not present

## 2019-09-23 DIAGNOSIS — M461 Sacroiliitis, not elsewhere classified: Secondary | ICD-10-CM | POA: Diagnosis not present

## 2019-09-30 ENCOUNTER — Other Ambulatory Visit: Payer: Self-pay | Admitting: Family Medicine

## 2019-09-30 DIAGNOSIS — I10 Essential (primary) hypertension: Secondary | ICD-10-CM

## 2019-09-30 NOTE — Telephone Encounter (Signed)
PT need a refill  amLODipine (NORVASC) 5 MG tablet [[id number]]  telmisartan-hydrochlorothiazide (MICARDIS HCT) 80-25 MG tablet [[id number]]  [facility] - [place], [place] - [address]  [address] [place] [address]  Phone: [phone] Fax: [phone]

## 2020-02-21 ENCOUNTER — Other Ambulatory Visit: Payer: Self-pay | Admitting: Family Medicine

## 2020-02-21 DIAGNOSIS — I10 Essential (primary) hypertension: Secondary | ICD-10-CM

## 2020-03-08 DIAGNOSIS — H209 Unspecified iridocyclitis: Secondary | ICD-10-CM | POA: Diagnosis not present

## 2020-03-16 ENCOUNTER — Ambulatory Visit: Payer: BC Managed Care – PPO | Admitting: Family Medicine

## 2020-03-16 DIAGNOSIS — H209 Unspecified iridocyclitis: Secondary | ICD-10-CM | POA: Diagnosis not present

## 2020-04-06 DIAGNOSIS — H35352 Cystoid macular degeneration, left eye: Secondary | ICD-10-CM | POA: Diagnosis not present

## 2020-04-11 ENCOUNTER — Ambulatory Visit: Payer: BC Managed Care – PPO | Admitting: Family Medicine

## 2020-04-12 ENCOUNTER — Ambulatory Visit: Payer: BC Managed Care – PPO | Admitting: Family Medicine

## 2020-05-08 NOTE — Progress Notes (Signed)
Name: Deanna Perkins   MRN: [id number]    DOB: 1965/04/26   Date:05/10/2020       Progress Note  Subjective  Chief Complaint  Medication Refill  HPI  HTN: taking medication daily and denies side effects of medication. No chest pain, palpitation or SOB, denies dizziness . BP is at goal  Insomnia:  She takes Temazepam prn, usually every other day, she has been out of medication months ago, and is taking a sleep aid otc but does help as much as temazepam. It helps her fall and stay asleep for about 7 hours. She denies side effects   Major Depression in Remission she was very depressed from 2008  until 2017. She took Citlaopram for years but weaned self off a few years ago. She was having inability to sleep, gained weight, fatigue, she had lack of motivation but still able to work. She states she did not care about anything. She was doing well, still gets annoyed with her daughter and had a mild relapse when her sister died 03/11/2022from Urosepsis   Morbid Obesity: BMI still above 40, since last year she stop drinking a 2 liter soda bottle to one cup daily, she also decreased portion size and has been more active. She lost 12 lbs prior to last visit and only gained 3 pounds back, she states ate a little more when her sister died in 03-17-2022 and was grieving, but doing better now   Pre-diabetes: last A1C was 5.9% previous was 5.7%  she denies polyphagia, polydipsia or polyuria. Family history of DM. She did not see dietician, changed diet we will check labs today   Dyslipidemia: sheis eating healthier, reviewed last labs , ASCVD was low , we will recheck labs and decide on statin therapy   The 10-year ASCVD risk score Denman George DC Montez Hageman., et al., 2013) is: 4.4%   Values used to calculate the score:     Age: 55 years     Sex: Female     Is Non-Hispanic African American: Yes     Diabetic: No     Tobacco smoker: No     Systolic Blood Pressure: 132 mmHg     Is BP treated: Yes     HDL Cholesterol: 73  mg/dL     Total Cholesterol: 235 mg/dL  Chronic recurrent right hip pain: she is under the care of Dr. Odis Luster, had injections on hip and spine, she has daily pain, usually a 3-4/10 but can go up higher depending on activity   Leucopenia: we will recheck labs   Patient Active Problem List   Diagnosis Date Noted  . Other neutropenia (HCC) 03/17/2018  . Depression, major, in remission (HCC) 03/17/2018  . Chronic right hip pain 03/17/2018  . Morbid obesity (HCC) 09/22/2017  . Pre-diabetes 11/19/2015  . Obesity, Class III, BMI 40-49.9 (morbid obesity) (HCC) 09/20/2014  . Back pain, chronic 09/17/2014  . Benign essential HTN 09/17/2014  . History of cataract surgery 09/17/2014  . Dyslipidemia 09/17/2014  . Iridocyclitis 09/17/2014  . Osteoarthritis of both knees 09/17/2014  . History of menorrhagia 09/17/2014  . Insomnia 09/17/2014  . Vitamin D deficiency 09/17/2014    Past Surgical History:  Procedure Laterality Date  . BREAST SURGERY     reduction  . CERVICAL CERCLAGE    . DILATION AND CURETTAGE OF UTERUS  2008  . EYE SURGERY Bilateral    cataracts  . foot suregry      Family History  Problem Relation  Age of Onset  . Diabetes Mother   . Hypertension Mother   . Stroke Mother   . Cancer Father        lung  . Hypertension Father   . Cancer Maternal Aunt        breast    Social History   Tobacco Use  . Smoking status: Never Smoker  . Smokeless tobacco: Never Used  Substance Use Topics  . Alcohol use: Yes    Alcohol/week: 0.0 standard drinks    Comment: rarely     Current Outpatient Medications:  .  amLODipine (NORVASC) 5 MG tablet, TAKE 1 TABLET DAILY FOR BLOOD PRESSURE, Disp: 90 tablet, Rfl: 1 .  cholecalciferol (VITAMIN D3) 25 MCG (1000 UNIT) tablet, Take 1,000 Units by mouth daily., Disp: , Rfl:  .  prednisoLONE acetate (PRED FORTE) 1 % ophthalmic suspension, prednisolone acetate 1 % eye drops,suspension  INSTILL 1 DROP INTO BOTH EYES 3 TIMES A DAY AND  SLOWLY TAPER, Disp: , Rfl:  .  telmisartan-hydrochlorothiazide (MICARDIS HCT) 80-25 MG tablet, TAKE 1 TABLET DAILY, Disp: 90 tablet, Rfl: 1 .  temazepam (RESTORIL) 15 MG capsule, Take 1 capsule (15 mg total) by mouth at bedtime., Disp: 90 capsule, Rfl: 0 .  Vitamin D, Ergocalciferol, (DRISDOL) 1.25 MG (50000 UNIT) CAPS capsule, Take 1 capsule (50,000 Units total) by mouth every 7 (seven) days., Disp: 12 capsule, Rfl: 1  Allergies  Allergen Reactions  . Chocolate Hives    I personally reviewed active problem list, medication list, allergies, family history, social history, health maintenance, notes from last encounter with the patient/caregiver today.   ROS  Constitutional: Negative for fever or weight change.  Respiratory: Negative for cough and shortness of breath.   Cardiovascular: Negative for chest pain or palpitations.  Gastrointestinal: Negative for abdominal pain, no bowel changes.  Musculoskeletal: positive for gait problem or joint swelling.  Skin: Negative for rash.  Neurological: Negative for dizziness or headache.  No other specific complaints in a complete review of systems (except as listed in HPI above).  Objective  Vitals:   05/10/20 1137  BP: 132/86  Pulse: 86  Resp: 16  Temp: 97.6 F (36.4 C)  TempSrc: Oral  SpO2: 95%  Weight: 245 lb (111.1 kg)  Height: 5\' 4"  (1.626 m)    Body mass index is 42.05 kg/m.  Physical Exam  Constitutional: Patient appears well-developed and well-nourished. Obese  No distress.  HEENT: head atraumatic, normocephalic, pupils equal and reactive to light, neck supple Cardiovascular: Normal rate, regular rhythm and normal heart sounds.  No murmur heard. No BLE edema. Pulmonary/Chest: Effort normal and breath sounds normal. No respiratory distress. Abdominal: Soft.  There is no tenderness. Muscular skeletal: normal rom of hips, genu valgum, antalgic gait, favors right leg Psychiatric: Patient has a normal mood and affect.  behavior is normal. Judgment and thought content normal.  PHQ2/9: Depression screen [facility] 2/9 05/10/2020 09/15/2019 03/17/2019 09/23/2018 03/17/2018  Decreased Interest 0 0 0 0 0  Down, Depressed, Hopeless 0 0 0 0 0  PHQ - 2 Score 0 0 0 0 0  Altered sleeping - - 1 1 0  Tired, decreased energy - - 0 0 0  Change in appetite - - 0 1 0  Feeling bad or failure about yourself  - - 0 0 0  Trouble concentrating - - 0 0 0  Moving slowly or fidgety/restless - - 0 0 0  Suicidal thoughts - - 0 0 0  PHQ-9 Score - - 1  2 0  Difficult doing work/chores - - Not difficult at all Not difficult at all Not difficult at all    phq 9 is negative   Fall Risk: Fall Risk  05/10/2020 09/15/2019 03/17/2019 09/23/2018 03/17/2018  Falls in the past year? 0 0 1 1 0  Number falls in past yr: 0 0 1 1 0  Injury with Fall? 0 0 1 1 0  Comment - - - Trouble with her right hip -  Risk for fall due to : - - - Impaired balance/gait -  Follow up - - - - -     Functional Status Survey: Is the patient deaf or have difficulty hearing?: No Does the patient have difficulty seeing, even when wearing glasses/contacts?: No Does the patient have difficulty concentrating, remembering, or making decisions?: No Does the patient have difficulty walking or climbing stairs?: No Does the patient have difficulty dressing or bathing?: No Does the patient have difficulty doing errands alone such as visiting a doctor's office or shopping?: No   Assessment & Plan  1. Dyslipidemia  - Lipid panel  2. Pre-diabetes  - Hemoglobin A1c  3. B12 deficiency  - CBC with Differential/Platelet - Vitamin B12  4. Benign essential HTN  - CBC with Differential/Platelet - COMPLETE METABOLIC PANEL WITH GFR  5. Morbid obesity (HCC)  Discussed with the patient the risk posed by an increased BMI. Discussed importance of portion control, calorie counting and at least 150 minutes of physical activity weekly. Avoid sweet beverages and drink more water. Eat  at least 6 servings of fruit and vegetables daily   6. Depression, major, in remission (HCC)   7. Colon cancer screening  - Ambulatory referral to Gastroenterology  8. Breast cancer screening by mammogram  - MM 3D SCREEN BREAST BILATERAL; Future  9. Vitamin D deficiency  - VITAMIN D 25 Hydroxy (Vit-D Deficiency, Fractures)  10. Primary insomnia  - temazepam (RESTORIL) 15 MG capsule; Take 1 capsule (15 mg total) by mouth at bedtime.  Dispense: 90 capsule; Refill: 0  11. Need for Tdap vaccination  - Tdap vaccine greater than or equal to 7yo IM  12. Need for shingles vaccine  Refused it today

## 2020-05-10 ENCOUNTER — Encounter: Payer: Self-pay | Admitting: Family Medicine

## 2020-05-10 ENCOUNTER — Ambulatory Visit (INDEPENDENT_AMBULATORY_CARE_PROVIDER_SITE_OTHER): Payer: BC Managed Care – PPO | Admitting: Family Medicine

## 2020-05-10 DIAGNOSIS — Z23 Encounter for immunization: Secondary | ICD-10-CM

## 2020-05-10 DIAGNOSIS — F325 Major depressive disorder, single episode, in full remission: Secondary | ICD-10-CM

## 2020-05-10 DIAGNOSIS — Z1211 Encounter for screening for malignant neoplasm of colon: Secondary | ICD-10-CM

## 2020-05-10 DIAGNOSIS — R7303 Prediabetes: Secondary | ICD-10-CM

## 2020-05-10 DIAGNOSIS — E538 Deficiency of other specified B group vitamins: Secondary | ICD-10-CM | POA: Diagnosis not present

## 2020-05-10 DIAGNOSIS — E559 Vitamin D deficiency, unspecified: Secondary | ICD-10-CM

## 2020-05-10 DIAGNOSIS — E785 Hyperlipidemia, unspecified: Secondary | ICD-10-CM | POA: Diagnosis not present

## 2020-05-10 DIAGNOSIS — I10 Essential (primary) hypertension: Secondary | ICD-10-CM

## 2020-05-10 DIAGNOSIS — Z1231 Encounter for screening mammogram for malignant neoplasm of breast: Secondary | ICD-10-CM

## 2020-05-10 DIAGNOSIS — F5101 Primary insomnia: Secondary | ICD-10-CM

## 2020-05-10 MED ORDER — TEMAZEPAM 15 MG PO CAPS: 15.0000 mg | ORAL_CAPSULE | Freq: Every day | ORAL | 0 refills | Status: DC

## 2020-09-07 ENCOUNTER — Other Ambulatory Visit: Payer: Self-pay | Admitting: Family Medicine

## 2020-09-07 DIAGNOSIS — I10 Essential (primary) hypertension: Secondary | ICD-10-CM

## 2020-09-07 NOTE — Telephone Encounter (Signed)
Patient will need an office visit for further refills. Requested Prescriptions  Pending Prescriptions Disp Refills  . amLODipine (NORVASC) 5 MG tablet [Pharmacy Med Name: AMLODIPINE BESYLATE TABS 5MG ] 90 tablet 0    Sig: TAKE 1 TABLET DAILY FOR BLOOD PRESSURE     Cardiovascular:  Calcium Channel Blockers Passed - 09/07/2020  1:05 AM      Passed - Last BP in normal range    BP Readings from Last 1 Encounters:  05/10/20 132/86         Passed - Valid encounter within last 6 months    Recent Outpatient Visits          4 months ago Morbid obesity [facility])   [facility], MD   11 months ago Morbid obesity [facility])   [facility], MD   1 year ago Encounter for screening mammogram for malignant neoplasm of breast   [facility] D, MD   1 year ago Pre-diabetes   [facility], MD   2 years ago Need for hepatitis C screening test   [facility] [place]             . telmisartan-hydrochlorothiazide (MICARDIS HCT) 80-25 MG tablet [Pharmacy Med Name: TELMISARTAN/HCTZ TABS 1'S 80/25] 90 tablet 0    Sig: TAKE 1 TABLET DAILY     Cardiovascular: ARB + Diuretic Combos Failed - 09/07/2020  1:05 AM      Failed - K in normal range and within 180 days    Potassium  Date Value Ref Range Status  09/15/2019 3.5 3.5 - 5.3 mmol/L Final         Failed - Na in normal range and within 180 days    Sodium  Date Value Ref Range Status  09/15/2019 141 135 - 146 mmol/L Final         Failed - Cr in normal range and within 180 days    Creat  Date Value Ref Range Status  09/15/2019 1.04 0.50 - 1.05 mg/dL Final    Comment:    For patients >34 years of age, the reference limit for Creatinine is approximately 13% higher for people identified as African-American. .          Failed - Ca in normal range and within 180 days    Calcium   Date Value Ref Range Status  09/15/2019 9.7 8.6 - 10.4 mg/dL Final         Passed - Patient is not pregnant      Passed - Last BP in normal range    BP Readings from Last 1 Encounters:  05/10/20 132/86         Passed - Valid encounter within last 6 months    Recent Outpatient Visits          4 months ago Morbid obesity [facility])   [facility], [place]   11 months ago Morbid obesity [facility])   [facility], MD   1 year ago Encounter for screening mammogram for malignant neoplasm of breast   [facility], MD   1 year ago Pre-diabetes   [facility], MD   2 years ago Need for hepatitis C screening test   [facility], [place] C[place]

## 2020-11-21 ENCOUNTER — Other Ambulatory Visit: Payer: Self-pay | Admitting: Family Medicine

## 2020-11-21 DIAGNOSIS — F5101 Primary insomnia: Secondary | ICD-10-CM

## 2020-11-23 NOTE — Telephone Encounter (Signed)
Appt scheduled for 11.19.2022

## 2020-12-06 ENCOUNTER — Other Ambulatory Visit: Payer: Self-pay | Admitting: Family Medicine

## 2020-12-06 DIAGNOSIS — I10 Essential (primary) hypertension: Secondary | ICD-10-CM

## 2020-12-22 NOTE — Progress Notes (Signed)
Name: Deanna Perkins   MRN: [id number]    DOB: Apr 12, 1965   Date:12/25/2020       Progress Note  Subjective  Chief Complaint  Medication Refill  HPI  HTN: taking medication daily and denies side effects of medication. No chest pain, palpitation or SOB, denies dizziness . BP is at goal    Insomnia:  She takes Temazepam prn, usually every other day, the medication helps her helps her fall and stay asleep for about 7 hours. She has been out of medication    Major Depression in Remission she was very depressed from 2008  until 2017. She took Citlaopram for years but weaned self off a few years ago. She was having inability to sleep, gained weight, fatigue, she had lack of motivation but still able to work. She states she did not care about anything. She was doing well, still gets annoyed with her daughter and had a mild relapse when her sister died 11-Mar-2022from Urosepsis . Currently feeling a little blue due to holidays, she states her daughter will spend time with her Phq 9 was positive but she would like to resume medication until Spring. We will send Celexa 20 mg to take daily     Morbid Obesity: BMI still above 40, she is working overtime and is snacking and drinking sodas more often again, gaining weight. She will try to resume a healthier diet  Pre-diabetes: last A1C was 5.9% previous was 5.7%  she denies polyphagia, polydipsia or polyuria. Family history of DM. She did not see dietician, she is gaining weight again We will recheck labs    Dyslipidemia: we will recheck labs today, not on medication   The 10-year ASCVD risk score (Arnett DK, et al., 2019) is: 4%   Values used to calculate the score:     Age: 55 years     Sex: Female     Is Non-Hispanic African American: Yes     Diabetic: No     Tobacco smoker: No     Systolic Blood Pressure: 128 mmHg     Is BP treated: Yes     HDL Cholesterol: 73 mg/dL     Total Cholesterol: 235 mg/dL    Chronic recurrent right hip pain: she is  under the care of Dr. Odis Luster, had injections on hip and spine, she has daily pain, usually a 3-4/10 but can go up higher depending on activity . She would like to see Dr. Ernest Pine for second opinion, she is not improving with steroid injections  Leucopenia: we will recheck labs today    Patient Active Problem List   Diagnosis Date Noted   Other neutropenia (HCC) 03/17/2018   Depression, major, in remission (HCC) 03/17/2018   Chronic right hip pain 03/17/2018   Morbid obesity (HCC) 09/22/2017   Pre-diabetes 11/19/2015   Obesity, Class III, BMI 40-49.9 (morbid obesity) (HCC) 09/20/2014   Back pain, chronic 09/17/2014   Benign essential HTN 09/17/2014   History of cataract surgery 09/17/2014   Dyslipidemia 09/17/2014   Iridocyclitis 09/17/2014   Osteoarthritis of both knees 09/17/2014   History of menorrhagia 09/17/2014   Insomnia 09/17/2014   Vitamin D deficiency 09/17/2014    Past Surgical History:  Procedure Laterality Date   BREAST SURGERY     reduction   CERVICAL CERCLAGE     DILATION AND CURETTAGE OF UTERUS  2008   EYE SURGERY Bilateral    cataracts   foot suregry      Family History  Problem Relation Age of Onset   Diabetes Mother    Hypertension Mother    Stroke Mother    Cancer Father        lung   Hypertension Father    Cancer Maternal Aunt        breast   Stroke Sister     Social History   Tobacco Use   Smoking status: Never   Smokeless tobacco: Never  Substance Use Topics   Alcohol use: Yes    Alcohol/week: 0.0 standard drinks    Comment: rarely     Current Outpatient Medications:    amLODipine (NORVASC) 5 MG tablet, TAKE 1 TABLET DAILY FOR BLOOD PRESSURE (WILL NEED AN OFFICE VISIT FOR FURTHER REFILLS), Disp: 90 tablet, Rfl: 0   cholecalciferol (VITAMIN D3) 25 MCG (1000 UNIT) tablet, Take 1,000 Units by mouth daily., Disp: , Rfl:    telmisartan-hydrochlorothiazide (MICARDIS HCT) 80-25 MG tablet, TAKE 1 TABLET DAILY (WILL NEED AN OFFICE VISIT FOR  FURTHER REFILLS), Disp: 90 tablet, Rfl: 0   temazepam (RESTORIL) 15 MG capsule, Take 1 capsule (15 mg total) by mouth at bedtime., Disp: 90 capsule, Rfl: 0   prednisoLONE acetate (PRED FORTE) 1 % ophthalmic suspension, prednisolone acetate 1 % eye drops,suspension  INSTILL 1 DROP INTO BOTH EYES 3 TIMES A DAY AND SLOWLY TAPER (Patient not taking: Reported on 12/25/2020), Disp: , Rfl:   Allergies  Allergen Reactions   Chocolate Hives    I personally reviewed active problem list, medication list, allergies, family history, social history, health maintenance with the patient/caregiver today.   ROS  Constitutional: Negative for fever or weight change.  Respiratory: Negative for cough and shortness of breath.   Cardiovascular: Negative for chest pain or palpitations.  Gastrointestinal: Negative for abdominal pain, no bowel changes.  Musculoskeletal: Negative for gait problem or joint swelling.  Skin: Negative for rash.  Neurological: Negative for dizziness or headache.  No other specific complaints in a complete review of systems (except as listed in HPI above).   Objective  Vitals:   12/25/20 1139  BP: 128/70  Pulse: 89  Resp: 16  Temp: 98 F (36.7 C)  SpO2: 98%  Weight: 252 lb (114.3 kg)  Height: 5\' 4"  (1.626 m)    Body mass index is 43.26 kg/m.  Physical Exam  Constitutional: Patient appears well-developed and well-nourished. Obese  No distress.  HEENT: head atraumatic, normocephalic, pupils equal and reactive to light, neck supple Cardiovascular: Normal rate, regular rhythm and normal heart sounds.  No murmur heard. No BLE edema. Pulmonary/Chest: Effort normal and breath sounds normal. No respiratory distress. Abdominal: Soft.  There is no tenderness. Psychiatric: Patient has a normal mood and affect. behavior is normal. Judgment and thought content normal.    PHQ2/9: Depression screen [facility] 2/9 12/25/2020 05/10/2020 09/15/2019 03/17/2019 09/23/2018  Decreased Interest 0 0 0  0 0  Down, Depressed, Hopeless 0 0 0 0 0  PHQ - 2 Score 0 0 0 0 0  Altered sleeping 0 - - 1 1  Tired, decreased energy 3 - - 0 0  Change in appetite 0 - - 0 1  Feeling bad or failure about yourself  0 - - 0 0  Trouble concentrating 0 - - 0 0  Moving slowly or fidgety/restless 0 - - 0 0  Suicidal thoughts 0 - - 0 0  PHQ-9 Score 3 - - 1 2  Difficult doing work/chores Not difficult at all - - Not difficult at all Not difficult at  all    phq 9 is  negative    Fall Risk: Fall Risk  12/25/2020 05/10/2020 09/15/2019 03/17/2019 09/23/2018  Falls in the past year? 1 0 0 1 1  Number falls in past yr: 1 0 0 1 1  Injury with Fall? 0 0 0 1 1  Comment - - - - Trouble with her right hip  Risk for fall due to : Impaired balance/gait - - - Impaired balance/gait  Follow up Falls prevention discussed - - - -      Functional Status Survey: Is the patient deaf or have difficulty hearing?: No Does the patient have difficulty seeing, even when wearing glasses/contacts?: Yes Does the patient have difficulty concentrating, remembering, or making decisions?: No Does the patient have difficulty walking or climbing stairs?: Yes Does the patient have difficulty dressing or bathing?: No Does the patient have difficulty doing errands alone such as visiting a doctor's office or shopping?: No    Assessment & Plan  1. Benign essential HTN  Bp is at goal   2. Morbid obesity (HCC)  Discussed with the patient the risk posed by an increased BMI. Discussed importance of portion control, calorie counting and at least 150 minutes of physical activity weekly. Avoid sweet beverages and drink more water. Eat at least 6 servings of fruit and vegetables daily    3. Primary insomnia  - temazepam (RESTORIL) 15 MG capsule; Take 1 capsule (15 mg total) by mouth at bedtime.  Dispense: 90 capsule; Refill: 0  4. Depression, major, in remission (HCC)  - citalopram (CELEXA) 20 MG tablet; Take 1 tablet (20 mg total) by  mouth daily.  Dispense: 90 tablet; Refill: 1  She has difficulty getting off work, follow up in 5 months, consider a virtual visit sooner for depression   5. Dyslipidemia   6. B12 deficiency   7. Pre-diabetes  Recheck labs   8. Vitamin D deficiency   9. Primary osteoarthritis of both knees  - Ambulatory referral to Orthopedic Surgery  10. Chronic right hip pain  - Ambulatory referral to Orthopedic Surgery

## 2020-12-25 ENCOUNTER — Encounter: Payer: Self-pay | Admitting: Family Medicine

## 2020-12-25 ENCOUNTER — Ambulatory Visit: Payer: BC Managed Care – PPO | Admitting: Family Medicine

## 2020-12-25 VITALS — BP 128/70 | HR 89 | Temp 98.0°F | Resp 16 | Ht 64.0 in | Wt 252.0 lb

## 2020-12-25 DIAGNOSIS — E559 Vitamin D deficiency, unspecified: Secondary | ICD-10-CM | POA: Diagnosis not present

## 2020-12-25 DIAGNOSIS — F325 Major depressive disorder, single episode, in full remission: Secondary | ICD-10-CM

## 2020-12-25 DIAGNOSIS — F5101 Primary insomnia: Secondary | ICD-10-CM | POA: Diagnosis not present

## 2020-12-25 DIAGNOSIS — R7303 Prediabetes: Secondary | ICD-10-CM

## 2020-12-25 DIAGNOSIS — M170 Bilateral primary osteoarthritis of knee: Secondary | ICD-10-CM

## 2020-12-25 DIAGNOSIS — I10 Essential (primary) hypertension: Secondary | ICD-10-CM

## 2020-12-25 DIAGNOSIS — E785 Hyperlipidemia, unspecified: Secondary | ICD-10-CM | POA: Diagnosis not present

## 2020-12-25 DIAGNOSIS — E538 Deficiency of other specified B group vitamins: Secondary | ICD-10-CM | POA: Diagnosis not present

## 2020-12-25 DIAGNOSIS — M25551 Pain in right hip: Secondary | ICD-10-CM

## 2020-12-25 DIAGNOSIS — G8929 Other chronic pain: Secondary | ICD-10-CM

## 2020-12-25 MED ORDER — CITALOPRAM HYDROBROMIDE 20 MG PO TABS: 20.0000 mg | ORAL_TABLET | Freq: Every day | ORAL | 1 refills | Status: DC

## 2020-12-25 MED ORDER — TEMAZEPAM 15 MG PO CAPS: 15.0000 mg | ORAL_CAPSULE | Freq: Every day | ORAL | 0 refills | Status: DC

## 2020-12-26 ENCOUNTER — Other Ambulatory Visit: Payer: Self-pay | Admitting: Family Medicine

## 2020-12-26 LAB — COMPLETE METABOLIC PANEL WITH GFR
AG Ratio: 1.1 (calc) (ref 1.0–2.5)
ALT: 37 U/L — ABNORMAL HIGH (ref 6–29)
AST: 27 U/L (ref 10–35)
Albumin: 4 g/dL (ref 3.6–5.1)
Alkaline phosphatase (APISO): 106 U/L (ref 37–153)
BUN: 10 mg/dL (ref 7–25)
CO2: 28 mmol/L (ref 20–32)
Calcium: 9.3 mg/dL (ref 8.6–10.4)
Chloride: 103 mmol/L (ref 98–110)
Creat: 0.9 mg/dL (ref 0.50–1.03)
Globulin: 3.5 g/dL (calc) (ref 1.9–3.7)
Glucose, Bld: 68 mg/dL (ref 65–139)
Potassium: 3.4 mmol/L — ABNORMAL LOW (ref 3.5–5.3)
Sodium: 141 mmol/L (ref 135–146)
Total Bilirubin: 0.7 mg/dL (ref 0.2–1.2)
Total Protein: 7.5 g/dL (ref 6.1–8.1)
eGFR: 76 mL/min/{1.73_m2} (ref >=60)

## 2020-12-26 LAB — LIPID PANEL
Cholesterol: 208 mg/dL — ABNORMAL HIGH (ref <200)
HDL: 59 mg/dL (ref >=50)
LDL Cholesterol (Calc): 128 mg/dL (calc) — ABNORMAL HIGH
Non-HDL Cholesterol (Calc): 149 mg/dL (calc) — ABNORMAL HIGH (ref <130)
Total CHOL/HDL Ratio: 3.5 (calc) (ref <5.0)
Triglycerides: 106 mg/dL (ref <150)

## 2020-12-26 LAB — CBC WITH DIFFERENTIAL/PLATELET
Absolute Monocytes: 539 cells/uL (ref 200–950)
Basophils Absolute: 39 cells/uL (ref 0–200)
Basophils Relative: 0.7 %
Eosinophils Absolute: 143 cells/uL (ref 15–500)
Eosinophils Relative: 2.6 %
HCT: 39.7 % (ref 35.0–45.0)
Hemoglobin: 13.7 g/dL (ref 11.7–15.5)
Lymphs Abs: 1584 cells/uL (ref 850–3900)
MCH: 30.2 pg (ref 27.0–33.0)
MCHC: 34.5 g/dL (ref 32.0–36.0)
MCV: 87.4 fL (ref 80.0–100.0)
MPV: 10.4 fL (ref 7.5–12.5)
Monocytes Relative: 9.8 %
Neutro Abs: 3196 cells/uL (ref 1500–7800)
Neutrophils Relative %: 58.1 %
Platelets: 332 10*3/uL (ref 140–400)
RBC: 4.54 10*6/uL (ref 3.80–5.10)
RDW: 13.6 % (ref 11.0–15.0)
Total Lymphocyte: 28.8 %
WBC: 5.5 10*3/uL (ref 3.8–10.8)

## 2020-12-26 LAB — HEMOGLOBIN A1C
Hgb A1c MFr Bld: 5.8 % of total Hgb — ABNORMAL HIGH (ref <5.7)
Mean Plasma Glucose: 120 mg/dL
eAG (mmol/L): 6.6 mmol/L

## 2020-12-26 LAB — VITAMIN B12: Vitamin B-12: 336 pg/mL (ref 200–1100)

## 2020-12-26 LAB — VITAMIN D 25 HYDROXY (VIT D DEFICIENCY, FRACTURES): Vit D, 25-Hydroxy: 13 ng/mL — ABNORMAL LOW (ref 30–100)

## 2020-12-26 MED ORDER — B-12 1000 MCG SL SUBL: 1.0000 | SUBLINGUAL_TABLET | SUBLINGUAL | 0 refills | Status: AC

## 2020-12-26 MED ORDER — VITAMIN D3 50 MCG (2000 UT) PO CAPS: 2000.0000 [IU] | ORAL_CAPSULE | Freq: Every day | ORAL | 1 refills | Status: AC

## 2020-12-27 ENCOUNTER — Ambulatory Visit: Payer: Self-pay | Admitting: *Deleted

## 2020-12-27 NOTE — Telephone Encounter (Signed)
Pt given lab results per notes of Dr. Carlynn Purl from 12/26/20 on 12/27/20. Pt verbalized understanding to increase potassium in diet, can take SL B 12 a few times a week 1000 mcg, vit D 2000 units daily.

## 2021-03-07 ENCOUNTER — Other Ambulatory Visit: Payer: Self-pay | Admitting: Family Medicine

## 2021-03-07 DIAGNOSIS — I10 Essential (primary) hypertension: Secondary | ICD-10-CM

## 2021-03-08 DIAGNOSIS — M5410 Radiculopathy, site unspecified: Secondary | ICD-10-CM | POA: Diagnosis not present

## 2021-03-08 DIAGNOSIS — G8929 Other chronic pain: Secondary | ICD-10-CM | POA: Diagnosis not present

## 2021-03-08 DIAGNOSIS — M25551 Pain in right hip: Secondary | ICD-10-CM | POA: Diagnosis not present

## 2021-03-08 DIAGNOSIS — M25552 Pain in left hip: Secondary | ICD-10-CM | POA: Diagnosis not present

## 2021-03-08 NOTE — Telephone Encounter (Signed)
Pt said that you did her med refill on 12-25-2020 and she did not get them for at that time she did not know who her pharm was and was to call back to let you know so that refill req would have been for then. She never got the refill for 12-25-2020 . She said that you documented it in her chart. Her pharmacy is now [facility] [address]

## 2021-03-19 ENCOUNTER — Other Ambulatory Visit: Payer: Self-pay | Admitting: Family Medicine

## 2021-03-19 DIAGNOSIS — I10 Essential (primary) hypertension: Secondary | ICD-10-CM

## 2021-03-19 MED ORDER — TELMISARTAN-HCTZ 80-25 MG PO TABS: ORAL_TABLET | ORAL | 0 refills | Status: DC

## 2021-03-19 MED ORDER — AMLODIPINE BESYLATE 5 MG PO TABS: ORAL_TABLET | ORAL | 0 refills | Status: DC

## 2021-03-19 NOTE — Telephone Encounter (Signed)
Medication Refill - Medication:  ?amLODipine (NORVASC) 5 MG tablet  ?telmisartan-hydrochlorothiazide (MICARDIS HCT) 80-25 MG tablet ? ? ?Has the patient contacted their pharmacy? Yes.   ?Contact PCP- was sent to the wrong pharmacy, needing it sent to [facility] ? ?Preferred Pharmacy (with phone number or street name):  ?[id number] - [place],  - [address]  ?Phone:  [phone] ?Fax:  [phone] ? ?Has the patient been seen for an appointment in the last year OR does the patient have an upcoming appointment? Yes.   ? ?Agent: Please be advised that RX refills may take up to 3 business days. We ask that you follow-up with your pharmacy. ?

## 2021-03-19 NOTE — Telephone Encounter (Signed)
Will resend to correct pharmacy the same amount as previously ordered by provider #90/0 refills. ? ?Requested Prescriptions  ?Pending Prescriptions Disp Refills  ?? amLODipine (NORVASC) 5 MG tablet 90 tablet 0  ?  Sig: TAKE 1 TABLET DAILY FOR BLOOD PRESSURE (WILL NEED AN OFFICE VISIT FOR FURTHER REFILLS)  ?  ? Cardiovascular: Calcium Channel Blockers 2 Passed - 03/19/2021  3:52 PM  ?  ?  Passed - Last BP in normal range  ?  BP Readings from Last 1 Encounters:  ?12/25/20 128/70  ?   ?  ?  Passed - Last Heart Rate in normal range  ?  Pulse Readings from Last 1 Encounters:  ?12/25/20 89  ?   ?  ?  Passed - Valid encounter within last 6 months  ?  Recent Outpatient Visits   ?      ? 2 months ago Depression, major, in remission ([place])  ? [facility], Drue Stager, MD  ? 10 months ago Morbid obesity [facility])  ? [facility] Steele Sizer, MD  ? 1 year ago Morbid obesity [facility])  ? [facility] Steele Sizer, MD  ? 2 years ago Encounter for screening mammogram for malignant neoplasm of breast  ? [facility] Lebron Conners D, MD  ? 2 years ago Pre-diabetes  ? [facility], Drue Stager, MD  ?  ?  ? ?  ?  ?  ?? telmisartan-hydrochlorothiazide (MICARDIS HCT) 80-25 MG tablet 90 tablet 0  ?  Sig: TAKE 1 TABLET DAILY (WILL NEED AN OFFICE VISIT FOR FURTHER REFILLS)  ?  ? Cardiovascular: ARB + Diuretic Combos Failed - 03/19/2021  3:52 PM  ?  ?  Failed - K in normal range and within 180 days  ?  Potassium  ?Date Value Ref Range Status  ?12/25/2020 3.4 (L) 3.5 - 5.3 mmol/L Final  ?   ?  ?  Passed - Na in normal range and within 180 days  ?  Sodium  ?Date Value Ref Range Status  ?12/25/2020 141 135 - 146 mmol/L Final  ?   ?  ?  Passed - Cr in normal range and within 180 days  ?  Creat  ?Date Value Ref Range Status  ?12/25/2020 0.90 0.50 - 1.03 mg/dL Final  ?   ?  ?  Passed - eGFR is 10 or above and within 180 days  ?  GFR, Est  African American  ?Date Value Ref Range Status  ?09/15/2019 71 > OR = 60 mL/min/1.24m Final  ? ?GFR, Est Non African American  ?Date Value Ref Range Status  ?09/15/2019 61 > OR = 60 mL/min/1.731mFinal  ? ?eGFR  ?Date Value Ref Range Status  ?12/25/2020 76 > OR = 60 mL/min/1.7368minal  ?  Comment:  ?  The eGFR is based on the CKD-EPI 2021 equation. To calculate  ?the new eGFR from a previous Creatinine or Cystatin C ?result, go to https://www.kidney.org/professionals/ ?kdoqi/gfr%5Fcalculator ?  ?   ?  ?  Passed - Patient is not pregnant  ?  ?  Passed - Last BP in normal range  ?  BP Readings from Last 1 Encounters:  ?12/25/20 128/70  ?   ?  ?  Passed - Valid encounter within last 6 months  ?  Recent Outpatient Visits   ?      ? 2 months ago Depression, major, in remission [place] [facility]  [facility]ue [facility] [place]  ? 10 months ago Morbid obesity ([place])  ? [facility] Steele Sizer, MD  ? 1 year ago Morbid obesity [facility])  ? [facility] Steele Sizer, MD  ? 2 years ago Encounter for screening mammogram for malignant neoplasm of breast  ? [facility] Lebron Conners D, MD  ? 2 years ago Pre-diabetes  ? [facility] Steele Sizer, MD  ?  ?  ? ?  ?  ?  ? ? ?

## 2021-05-29 DIAGNOSIS — E559 Vitamin D deficiency, unspecified: Secondary | ICD-10-CM | POA: Diagnosis not present

## 2021-05-29 DIAGNOSIS — E538 Deficiency of other specified B group vitamins: Secondary | ICD-10-CM | POA: Diagnosis not present

## 2021-05-29 DIAGNOSIS — Z8669 Personal history of other diseases of the nervous system and sense organs: Secondary | ICD-10-CM | POA: Diagnosis not present

## 2021-05-29 DIAGNOSIS — M25559 Pain in unspecified hip: Secondary | ICD-10-CM | POA: Diagnosis not present

## 2021-05-29 DIAGNOSIS — Z114 Encounter for screening for human immunodeficiency virus [HIV]: Secondary | ICD-10-CM | POA: Diagnosis not present

## 2021-05-29 DIAGNOSIS — Z113 Encounter for screening for infections with a predominantly sexual mode of transmission: Secondary | ICD-10-CM | POA: Diagnosis not present

## 2021-05-29 DIAGNOSIS — H02413 Mechanical ptosis of bilateral eyelids: Secondary | ICD-10-CM | POA: Diagnosis not present

## 2021-05-29 DIAGNOSIS — R29898 Other symptoms and signs involving the musculoskeletal system: Secondary | ICD-10-CM | POA: Diagnosis not present

## 2021-05-30 ENCOUNTER — Other Ambulatory Visit: Payer: Self-pay | Admitting: Neurology

## 2021-05-30 DIAGNOSIS — R29898 Other symptoms and signs involving the musculoskeletal system: Secondary | ICD-10-CM

## 2021-06-01 ENCOUNTER — Other Ambulatory Visit: Payer: Self-pay | Admitting: Family Medicine

## 2021-06-01 DIAGNOSIS — F5101 Primary insomnia: Secondary | ICD-10-CM

## 2021-06-01 DIAGNOSIS — I10 Essential (primary) hypertension: Secondary | ICD-10-CM

## 2021-06-26 ENCOUNTER — Ambulatory Visit
Admission: RE | Admit: 2021-06-26 | Discharge: 2021-06-26 | Disposition: A | Discharge status: Home / Self Care | Payer: BC Managed Care – PPO | Source: Ambulatory Visit | Attending: Neurology | Admitting: Neurology

## 2021-06-26 DIAGNOSIS — M50223 Other cervical disc displacement at C6-C7 level: Secondary | ICD-10-CM | POA: Diagnosis not present

## 2021-06-26 DIAGNOSIS — M47812 Spondylosis without myelopathy or radiculopathy, cervical region: Secondary | ICD-10-CM | POA: Diagnosis not present

## 2021-06-26 DIAGNOSIS — R29898 Other symptoms and signs involving the musculoskeletal system: Secondary | ICD-10-CM

## 2021-06-26 MED ORDER — GADOBENATE DIMEGLUMINE 529 MG/ML IV SOLN
20.0000 mL | Freq: Once | INTRAVENOUS | Status: AC | PRN
  Administered 2021-06-26: 20 mL via INTRAVENOUS

## 2021-07-02 DIAGNOSIS — Z8669 Personal history of other diseases of the nervous system and sense organs: Secondary | ICD-10-CM | POA: Diagnosis not present

## 2021-07-02 DIAGNOSIS — M5441 Lumbago with sciatica, right side: Secondary | ICD-10-CM | POA: Diagnosis not present

## 2021-07-02 DIAGNOSIS — R29898 Other symptoms and signs involving the musculoskeletal system: Secondary | ICD-10-CM | POA: Diagnosis not present

## 2021-07-02 DIAGNOSIS — G35 Multiple sclerosis: Secondary | ICD-10-CM | POA: Diagnosis not present

## 2021-07-23 DIAGNOSIS — G8929 Other chronic pain: Secondary | ICD-10-CM | POA: Diagnosis not present

## 2021-07-23 DIAGNOSIS — M5440 Lumbago with sciatica, unspecified side: Secondary | ICD-10-CM | POA: Diagnosis not present

## 2021-08-16 DIAGNOSIS — H3022 Posterior cyclitis, left eye: Secondary | ICD-10-CM | POA: Diagnosis not present

## 2021-08-17 NOTE — Progress Notes (Unsigned)
Name: Deanna Perkins   MRN: [id number]    DOB: [birth year]   Date:08/20/2021       Progress Note  Subjective  Chief Complaint  Discuss MS Dx  HPI  HTN: taking medication daily and denies side effects of medication. No chest pain, palpitation or SOB, denies dizziness . BP is at goal , Telmisartan hctz 80/25 no longer covered by insurance, she is also taking norvasc.    Insomnia:  She takes Temazepam prn, usually every other day, the medication helps her helps her fall and stay asleep for about 7 hours. She needs a refill, she has been more anxious since diagnosed of MS   MS: diagnosed by Dr. Sherryll Burger June 2023, she is going to PT due to right leg weakness , using a cane, going to see Dr. Sherryll Burger in a couple of weeks to start therapy. Symptoms going on for years, had seen Ortho, neurosurgeon, finally seen by Dr. Sherryll Burger and diagnosed She ocular symptoms and also right leg weakness and numbness, using a cane and we will give her a handicap sticker    Major Depression in Remission she was very depressed from 2008  until 2017. She took Citlaopram for years but weaned self off a few years ago. She was having inability to sleep, gained weight, fatigue, she had lack of motivation but still able to work. She states she did not care about anything. She is still medication, diagnosed with MS at the end of June and feeling overwhelmed but coping okay with diagnosis , she has a good support system now    Morbid Obesity: BMI still above 40, she lost 6 lbs since Dec 2022, she is feeling stressed due to recent diagnosis of MS. Discussed healthy meals.    Pre-diabetes: last A1C was 5.9%  previous was 5.7%  she denies polyphagia, polydipsia or polyuria. Family history of DM.    Dyslipidemia:   The 10-year ASCVD risk score (Arnett DK, et al., 2019) is: 4.5%   Values used to calculate the score:     Age: 56 years     Sex: Female     Is Non-Hispanic African American: Yes     Diabetic: No     Tobacco smoker: No      Systolic Blood Pressure: 126 mmHg     Is BP treated: Yes     HDL Cholesterol: 59 mg/dL     Total Cholesterol: 208 mg/dL         Patient Active Problem List   Diagnosis Date Noted   Other neutropenia (HCC) 03/17/2018   Depression, major, in remission (HCC) 03/17/2018   Chronic right hip pain 03/17/2018   Morbid obesity (HCC) 09/22/2017   Pre-diabetes 11/19/2015   Obesity, Class III, BMI 40-49.9 (morbid obesity) (HCC) 09/20/2014   Back pain, chronic 09/17/2014   Benign essential HTN 09/17/2014   History of cataract surgery 09/17/2014   Dyslipidemia 09/17/2014   Iridocyclitis 09/17/2014   Osteoarthritis of both knees 09/17/2014   History of menorrhagia 09/17/2014   Insomnia 09/17/2014   Vitamin D deficiency 09/17/2014    Past Surgical History:  Procedure Laterality Date   BREAST SURGERY     reduction   CERVICAL CERCLAGE     DILATION AND CURETTAGE OF UTERUS  2008   EYE SURGERY Bilateral    cataracts   foot suregry      Family History  Problem Relation Age of Onset   Diabetes Mother    Hypertension Mother    Stroke Mother  Cancer Father        lung   Hypertension Father    Cancer Maternal Aunt        breast   Stroke Sister     Social History   Tobacco Use   Smoking status: Never   Smokeless tobacco: Never  Substance Use Topics   Alcohol use: Yes    Alcohol/week: 0.0 standard drinks of alcohol    Comment: rarely     Current Outpatient Medications:    amLODipine (NORVASC) 5 MG tablet, TAKE 1 TABLET BY MOUTH DAILY FOR BLOOD PRESSURE, Disp: 90 tablet, Rfl: 0   Cholecalciferol (VITAMIN D3) 50 MCG (2000 UT) capsule, Take 1 capsule (2,000 Units total) by mouth daily., Disp: 100 capsule, Rfl: 1   citalopram (CELEXA) 20 MG tablet, Take 1 tablet (20 mg total) by mouth daily., Disp: 90 tablet, Rfl: 1   Cyanocobalamin (B-12) 1000 MCG SUBL, Place 1 tablet under the tongue 3 (three) times a week., Disp: 30 tablet, Rfl: 0   telmisartan-hydrochlorothiazide (MICARDIS  HCT) 80-25 MG tablet, TAKE 1 TABLET BY MOUTH DAILY, Disp: 90 tablet, Rfl: 0   temazepam (RESTORIL) 15 MG capsule, TAKE 1 CAPSULE(15 MG) BY MOUTH AT BEDTIME, Disp: 90 capsule, Rfl: 0  Allergies  Allergen Reactions   Chocolate Hives    I personally reviewed active problem list, medication list, allergies, family history, social history, health maintenance with the patient/caregiver today.   ROS  Constitutional: Negative for fever , positive for mild  weight change.  Respiratory: Negative for cough and shortness of breath.   Cardiovascular: Negative for chest pain or palpitations.  Gastrointestinal: Negative for abdominal pain, no bowel changes.  Musculoskeletal: positive  for gait problem no  joint swelling.  Skin: Negative for rash.  Neurological: Negative for dizziness or headache.  No other specific complaints in a complete review of systems (except as listed in HPI above).   Objective  Vitals:   08/20/21 1514  BP: 126/84  Pulse: 87  Resp: 16  SpO2: 98%  Weight: 246 lb (111.6 kg)  Height: 5\' 4"  (1.626 m)    Body mass index is 42.23 kg/m.  Physical Exam  Constitutional: Patient appears well-developed and well-nourished. Obese  No distress.  HEENT: head atraumatic, normocephalic, pupils equal and reactive to light,, neck supple, throat within normal limits Cardiovascular: Normal rate, regular rhythm and normal heart sounds.  No murmur heard. No BLE edema. Pulmonary/Chest: Effort normal and breath sounds normal. No respiratory distress. Abdominal: Soft.  There is no tenderness. Muscular skeletal: using a cane, she has heaviness of right leg, and is numb  Psychiatric: Patient has a normal mood and affect. behavior is normal. Judgment and thought content normal.   PHQ2/9:    08/20/2021    3:14 PM 12/25/2020   11:38 AM 05/10/2020   11:38 AM 09/15/2019    9:48 AM 03/17/2019   10:13 AM  Depression screen PHQ 2/9  Decreased Interest 0 3 0 0 0  Down, Depressed, Hopeless 1 3  0 0 0  PHQ - 2 Score 1 6 0 0 0  Altered sleeping 0 2   1  Tired, decreased energy 3 3   0  Change in appetite 0 0   0  Feeling bad or failure about yourself  1 0   0  Trouble concentrating 0 0   0  Moving slowly or fidgety/restless 0 0   0  Suicidal thoughts 0 0   0  PHQ-9 Score 5 11  1  Difficult doing work/chores  Somewhat difficult   Not difficult at all    phq 9 is positive   Fall Risk:    08/20/2021    3:14 PM 12/25/2020   11:38 AM 05/10/2020   11:38 AM 09/15/2019    9:48 AM 03/17/2019   10:12 AM  Fall Risk   Falls in the past year? 1 1 0 0 1  Number falls in past yr: 1 1 0 0 1  Injury with Fall? 0 0 0 0 1  Risk for fall due to : No Fall Risks Impaired balance/gait     Follow up Falls prevention discussed Falls prevention discussed         Functional Status Survey: Is the patient deaf or have difficulty hearing?: No Does the patient have difficulty seeing, even when wearing glasses/contacts?: No Does the patient have difficulty concentrating, remembering, or making decisions?: No Does the patient have difficulty walking or climbing stairs?: Yes Does the patient have difficulty dressing or bathing?: No Does the patient have difficulty doing errands alone such as visiting a doctor's office or shopping?: Yes    Assessment & Plan  1. MS (multiple sclerosis) (HCC)  Going back to Dr. Sherryll Burger to start therapy   2. Benign essential HTN  - olmesartan-hydrochlorothiazide (BENICAR HCT) 40-25 MG tablet; Take 1 tablet by mouth daily.  Dispense: 90 tablet; Refill: 1 - amLODipine (NORVASC) 5 MG tablet; Take 1 tablet (5 mg total) by mouth daily. TAKE 1 TABLET BY MOUTH DAILY FOR BLOOD PRESSURE  Dispense: 90 tablet; Refill: 1  3. Breast cancer screening by mammogram  - MM 3D SCREEN BREAST BILATERAL; Future  4. Need for shingles vaccine  - Varicella-zoster vaccine IM  5. Colon cancer screening  - Ambulatory referral to Gastroenterology  6. Depression, major, in remission  (HCC)  - citalopram (CELEXA) 20 MG tablet; Take 1 tablet (20 mg total) by mouth daily.  Dispense: 90 tablet; Refill: 1  7. Primary insomnia  - temazepam (RESTORIL) 15 MG capsule; Take 1 capsule (15 mg total) by mouth at bedtime.  Dispense: 90 capsule; Refill: 0  8. Need for pneumococcal 20-valent conjugate vaccination  - Pneumococcal conjugate vaccine 20-valent (Prevnar 20)

## 2021-08-20 ENCOUNTER — Ambulatory Visit: Payer: BC Managed Care – PPO | Admitting: Family Medicine

## 2021-08-20 ENCOUNTER — Encounter: Payer: Self-pay | Admitting: Family Medicine

## 2021-08-20 ENCOUNTER — Telehealth: Payer: BC Managed Care – PPO | Admitting: Family Medicine

## 2021-08-20 VITALS — BP 126/84 | HR 87 | Resp 16 | Ht 64.0 in | Wt 246.0 lb

## 2021-08-20 DIAGNOSIS — Z1231 Encounter for screening mammogram for malignant neoplasm of breast: Secondary | ICD-10-CM | POA: Diagnosis not present

## 2021-08-20 DIAGNOSIS — Z23 Encounter for immunization: Secondary | ICD-10-CM | POA: Diagnosis not present

## 2021-08-20 DIAGNOSIS — G35 Multiple sclerosis: Secondary | ICD-10-CM | POA: Diagnosis not present

## 2021-08-20 DIAGNOSIS — Z1211 Encounter for screening for malignant neoplasm of colon: Secondary | ICD-10-CM

## 2021-08-20 DIAGNOSIS — I10 Essential (primary) hypertension: Secondary | ICD-10-CM | POA: Diagnosis not present

## 2021-08-20 DIAGNOSIS — F325 Major depressive disorder, single episode, in full remission: Secondary | ICD-10-CM

## 2021-08-20 DIAGNOSIS — F5101 Primary insomnia: Secondary | ICD-10-CM

## 2021-08-20 MED ORDER — TEMAZEPAM 15 MG PO CAPS: 15.0000 mg | ORAL_CAPSULE | Freq: Every day | ORAL | 0 refills | Status: DC

## 2021-08-20 MED ORDER — AMLODIPINE BESYLATE 5 MG PO TABS: 5.0000 mg | ORAL_TABLET | Freq: Every day | ORAL | 1 refills | Status: DC

## 2021-08-20 MED ORDER — OLMESARTAN MEDOXOMIL-HCTZ 40-25 MG PO TABS: 1.0000 | ORAL_TABLET | Freq: Every day | ORAL | 1 refills | Status: DC

## 2021-08-20 MED ORDER — CITALOPRAM HYDROBROMIDE 20 MG PO TABS: 20.0000 mg | ORAL_TABLET | Freq: Every day | ORAL | 1 refills | Status: DC

## 2021-08-20 NOTE — Patient Instructions (Addendum)
[facility] [phone]  

## 2021-08-23 DIAGNOSIS — H35352 Cystoid macular degeneration, left eye: Secondary | ICD-10-CM | POA: Diagnosis not present

## 2021-08-28 ENCOUNTER — Other Ambulatory Visit: Payer: Self-pay | Admitting: Nurse Practitioner

## 2021-08-28 DIAGNOSIS — I10 Essential (primary) hypertension: Secondary | ICD-10-CM

## 2021-08-29 NOTE — Telephone Encounter (Signed)
Requested medication (s) are due for refill today: no  Requested medication (s) are on the active medication list: yes  Last refill:  08/20/21  Future visit scheduled: yes  Notes to clinic:  Unable to refill per protocol, last refill by provider 08/20/21 for 90 and 1 RF for amlodipine.  HCTZ was discontinued 08/20/21.     Requested Prescriptions  Pending Prescriptions Disp Refills   amLODipine (NORVASC) 5 MG tablet [Pharmacy Med Name: AMLODIPINE BESYLATE 5MG TABLETS] 90 tablet 1    Sig: TAKE 1 TABLET BY MOUTH DAILY FOR BLOOD PRESSURE     Cardiovascular: Calcium Channel Blockers 2 Passed - 08/28/2021 10:22 AM      Passed - Last BP in normal range    BP Readings from Last 1 Encounters:  08/20/21 126/84         Passed - Last Heart Rate in normal range    Pulse Readings from Last 1 Encounters:  08/20/21 87         Passed - Valid encounter within last 6 months    Recent Outpatient Visits           1 week ago MS (multiple sclerosis) ([place])   [facility], Drue Stager, MD   8 months ago Depression, major, in remission [facility])   [facility], Drue Stager, MD   1 year ago Morbid obesity [facility])   [facility] Steele Sizer, MD   1 year ago Morbid obesity [facility])   [facility] Steele Sizer, MD   2 years ago Encounter for screening mammogram for malignant neoplasm of breast   [facility] Towanda Malkin, MD       Future Appointments             In 2 months Ancil Boozer, Drue Stager, MD [facility], [facility]             telmisartan-hydrochlorothiazide (MICARDIS HCT) 80-25 MG tablet [Pharmacy Med Name: TELMISARTAN/HCTZ 80-25MG TABS] 90 tablet 0    Sig: TAKE 1 TABLET BY MOUTH DAILY     Cardiovascular: ARB + Diuretic Combos Failed - 08/28/2021 10:22 AM      Failed - K in normal range and within 180 days    Potassium  Date Value Ref Range Status  12/25/2020 3.4  (L) 3.5 - 5.3 mmol/L Final         Failed - Na in normal range and within 180 days    Sodium  Date Value Ref Range Status  12/25/2020 141 135 - 146 mmol/L Final         Failed - Cr in normal range and within 180 days    Creat  Date Value Ref Range Status  12/25/2020 0.90 0.50 - 1.03 mg/dL Final         Failed - eGFR is 10 or above and within 180 days    GFR, Est African American  Date Value Ref Range Status  09/15/2019 71 > OR = 60 mL/min/1.69m Final   GFR, Est Non African American  Date Value Ref Range Status  09/15/2019 61 > OR = 60 mL/min/1.729mFinal   eGFR  Date Value Ref Range Status  12/25/2020 76 > OR = 60 mL/min/1.7365minal    Comment:    The eGFR is based on the CKD-EPI 2021 equation. To calculate  the new eGFR from a previous Creatinine or Cystatin C result, go to https://www.kidney.org/professionals/ kdoqi/gfr%5Fcalculator  Passed - Patient is not pregnant      Passed - Last BP in normal range    BP Readings from Last 1 Encounters:  08/20/21 126/84         Passed - Valid encounter within last 6 months    Recent Outpatient Visits           1 week ago MS (multiple sclerosis) [facility])   [facility], Drue Stager, MD   8 months ago Depression, major, in remission [facility])   [facility] Steele Sizer, MD   1 year ago Morbid obesity [facility])   [facility] Steele Sizer, MD   1 year ago Morbid obesity [facility])   [facility] Sizer, MD   2 years ago Encounter for screening mammogram for malignant neoplasm of breast   [facility] Towanda Malkin, MD       Future Appointments             In 2 months Ancil Boozer, Drue Stager, MD [facility], [facility]

## 2021-08-30 DIAGNOSIS — G35 Multiple sclerosis: Secondary | ICD-10-CM | POA: Diagnosis not present

## 2021-08-30 DIAGNOSIS — Z79899 Other long term (current) drug therapy: Secondary | ICD-10-CM | POA: Diagnosis not present

## 2021-08-30 DIAGNOSIS — E559 Vitamin D deficiency, unspecified: Secondary | ICD-10-CM | POA: Diagnosis not present

## 2021-08-30 DIAGNOSIS — M545 Low back pain, unspecified: Secondary | ICD-10-CM | POA: Diagnosis not present

## 2021-09-13 DIAGNOSIS — H3022 Posterior cyclitis, left eye: Secondary | ICD-10-CM | POA: Diagnosis not present

## 2021-09-14 ENCOUNTER — Other Ambulatory Visit: Payer: Self-pay | Admitting: Family Medicine

## 2021-09-14 DIAGNOSIS — I10 Essential (primary) hypertension: Secondary | ICD-10-CM

## 2021-09-14 DIAGNOSIS — F325 Major depressive disorder, single episode, in full remission: Secondary | ICD-10-CM

## 2021-09-14 DIAGNOSIS — F5101 Primary insomnia: Secondary | ICD-10-CM

## 2021-09-14 NOTE — Telephone Encounter (Signed)
Medication Refill - Medication: amLODipine (NORVASC) 5 MG tablet, citalopram (CELEXA) 20 MG tablet, olmesartan-hydrochlorothiazide (BENICAR HCT) 40-25 MG tablet, temazepam (RESTORIL) 15 MG capsule  Has the patient contacted their pharmacy? Yes.     Preferred Pharmacy (with phone number or street name):  [id number] [place], [place] - [address] AT [facility] [address] Phone:  [phone]  Fax:  [phone]     Has the patient been seen for an appointment in the last year OR does the patient have an upcoming appointment? Yes.    [facility] but patient states they are no longer a vendor and needs the refills to be [facility]. I have removed [facility] from her file completely per her request. Please assist patient further

## 2021-09-17 MED ORDER — AMLODIPINE BESYLATE 5 MG PO TABS: 5.0000 mg | ORAL_TABLET | Freq: Every day | ORAL | 1 refills | Status: DC

## 2021-09-17 MED ORDER — CITALOPRAM HYDROBROMIDE 20 MG PO TABS: 20.0000 mg | ORAL_TABLET | Freq: Every day | ORAL | 1 refills | Status: DC

## 2021-09-17 MED ORDER — OLMESARTAN MEDOXOMIL-HCTZ 40-25 MG PO TABS: 1.0000 | ORAL_TABLET | Freq: Every day | ORAL | 1 refills | Status: DC

## 2021-09-17 NOTE — Telephone Encounter (Signed)
Pt was unable to receive meds from this mail order pharm. Has requested at local pharm

## 2021-09-17 NOTE — Telephone Encounter (Signed)
Questioning if pt already received these meds from mail order since they were sent 3 weeks ago. Message left.

## 2021-09-17 NOTE — Telephone Encounter (Signed)
Requested medication (s) are due for refill today: yes  Requested medication (s) are on the active medication list: yes  Last refill:  Sent to mail order  08/20/21 #90 with 0 RF which her insurance did not cover, requesting local [facility]  Future visit scheduled: 11/06/21, seen 08/20/21  Notes to clinic:  Mail order rx unable to be filled, please order at local [place], not delegated, please assess.      Requested Prescriptions  Pending Prescriptions Disp Refills   temazepam (RESTORIL) 15 MG capsule 90 capsule 0    Sig: Take 1 capsule (15 mg total) by mouth at bedtime.     Not Delegated - Psychiatry: Anxiolytics/Hypnotics 2 Failed - 09/14/2021  1:43 PM      Failed - This refill cannot be delegated      Failed - Urine Drug Screen completed in last 360 days      Passed - Patient is not pregnant      Passed - Valid encounter within last 6 months    Recent Outpatient Visits           4 weeks ago MS (multiple sclerosis) [facility])   [facility] [place], Drue Stager, MD   8 months ago Depression, major, in remission [facility])   [facility] Steele Sizer, MD   1 year ago Morbid obesity [facility])   [facility]izer, MD   2 years ago Morbid obesity [facility])   [facility] Sizer, MD   2 years ago Encounter for screening mammogram for malignant neoplasm of breast   [facility] Lebron Conners D, MD       Future Appointments             In 1 month Steele Sizer, MD [facility], [facility]            Signed Prescriptions Disp Refills   amLODipine (NORVASC) 5 MG tablet 90 tablet 1    Sig: Take 1 tablet (5 mg total) by mouth daily. TAKE 1 TABLET BY MOUTH DAILY FOR BLOOD PRESSURE     Cardiovascular: Calcium Channel Blockers 2 Passed - 09/14/2021  1:43 PM      Passed - Last BP in normal range    BP Readings from Last 1 Encounters:  08/20/21 126/84          Passed - Last Heart Rate in normal range    Pulse Readings from Last 1 Encounters:  08/20/21 87         Passed - Valid encounter within last 6 months    Recent Outpatient Visits           4 weeks ago MS (multiple sclerosis) [facility])   [facility] [place], Drue Stager, MD   8 months ago Depression, major, in remission [facility])   [facility] Steele Sizer, MD   1 year ago Morbid obesity [facility])   [facility], MD   2 years ago Morbid obesity [facility])   [facility] Sizer, MD   2 years ago Encounter for screening mammogram for malignant neoplasm of breast   [facility] Towanda Malkin, MD       Future Appointments             In 1 month Steele Sizer, MD [facility]             citalopram (Galva) 20  MG tablet 90 tablet 1    Sig: Take 1 tablet (20 mg total) by mouth daily.     Psychiatry:  Antidepressants - SSRI Passed - 09/14/2021  1:43 PM      Passed - Completed PHQ-2 or PHQ-9 in the last 360 days      Passed - Valid encounter within last 6 months    Recent Outpatient Visits           4 weeks ago MS (multiple sclerosis) [facility])   [facility] [place], Drue Stager, MD   8 months ago Depression, major, in remission [facility])   [facility] Steele Sizer, MD   1 year ago Morbid obesity [facility])   [facility] Sizer, MD   2 years ago Morbid obesity [facility])   [facility], MD   2 years ago Encounter for screening mammogram for malignant neoplasm of breast   [facility] Towanda Malkin, MD       Future Appointments             In 1 month Steele Sizer, MD [facility], [facility]             olmesartan-hydrochlorothiazide (BENICAR HCT) 40-25 MG tablet 90 tablet 1    Sig: Take 1 tablet  by mouth daily.     Cardiovascular: ARB + Diuretic Combos Failed - 09/14/2021  1:43 PM      Failed - K in normal range and within 180 days    Potassium  Date Value Ref Range Status  12/25/2020 3.4 (L) 3.5 - 5.3 mmol/L Final         Failed - Na in normal range and within 180 days    Sodium  Date Value Ref Range Status  12/25/2020 141 135 - 146 mmol/L Final         Failed - Cr in normal range and within 180 days    Creat  Date Value Ref Range Status  12/25/2020 0.90 0.50 - 1.03 mg/dL Final         Failed - eGFR is 10 or above and within 180 days    GFR, Est African American  Date Value Ref Range Status  09/15/2019 71 > OR = 60 mL/min/1.19m Final   GFR, Est Non African American  Date Value Ref Range Status  09/15/2019 61 > OR = 60 mL/min/1.757mFinal   eGFR  Date Value Ref Range Status  12/25/2020 76 > OR = 60 mL/min/1.7318minal    Comment:    The eGFR is based on the CKD-EPI 2021 equation. To calculate  the new eGFR from a previous Creatinine or Cystatin C result, go to https://www.kidney.org/professionals/ kdoqi/gfr%5Fcalculator          Passed - Patient is not pregnant      Passed - Last BP in normal range    BP Readings from Last 1 Encounters:  08/20/21 126/84         Passed - Valid encounter within last 6 months    Recent Outpatient Visits           4 weeks [facility]   8 months ago Depression, major, in remission ([facility]   1 year ago Morbid obesity ([facility]   2 years ago Morbid obesity (  [facility])   [facility], MD   2 years ago Encounter for screening mammogram for malignant neoplasm of breast   [facility] Towanda Malkin, MD       Future Appointments             In 1 month Ancil Boozer, Drue Stager, MD [facility], [facility]

## 2021-09-17 NOTE — Telephone Encounter (Signed)
Requested Prescriptions  Pending Prescriptions Disp Refills  . amLODipine (NORVASC) 5 MG tablet 90 tablet 1    Sig: Take 1 tablet (5 mg total) by mouth daily. TAKE 1 TABLET BY MOUTH DAILY FOR BLOOD PRESSURE     Cardiovascular: Calcium Channel Blockers 2 Passed - 09/14/2021  1:43 PM      Passed - Last BP in normal range    BP Readings from Last 1 Encounters:  08/20/21 126/84         Passed - Last Heart Rate in normal range    Pulse Readings from Last 1 Encounters:  08/20/21 87         Passed - Valid encounter within last 6 months    Recent Outpatient Visits          4 weeks ago MS (multiple sclerosis) [facility])   [facility], Drue Stager, MD   8 months ago Depression, major, in remission [facility])   [facility] Steele Sizer, MD   1 year ago Morbid obesity [facility])   [facility] Sizer, MD   2 years ago Morbid obesity [facility])   [facility] Sizer, MD   2 years ago Encounter for screening mammogram for malignant neoplasm of breast   [facility] Lebron Conners D, MD      Future Appointments            In 1 month Steele Sizer, MD [facility], [facility]           . citalopram (CELEXA) 20 MG tablet 90 tablet 1    Sig: Take 1 tablet (20 mg total) by mouth daily.     Psychiatry:  Antidepressants - SSRI Passed - 09/14/2021  1:43 PM      Passed - Completed PHQ-2 or PHQ-9 in the last 360 days      Passed - Valid encounter within last 6 months    Recent Outpatient Visits          4 weeks ago MS (multiple sclerosis) [facility])   [facility] [place], Drue Stager, MD   8 months ago Depression, major, in remission [facility])   [facility] Steele Sizer, MD   1 year ago Morbid obesity [facility])   [facility] Steele Sizer, MD   2 years ago Morbid obesity [facility])   [facility] Steele Sizer, MD   2 years ago Encounter for screening mammogram for malignant neoplasm of breast   [facility] Towanda Malkin, MD      Future Appointments            In 1 month Steele Sizer, MD [facility] [place]           . olmesartan-hydrochlorothiazide (BENICAR HCT) 40-25 MG tablet 90 tablet 1    Sig: Take 1 tablet by mouth daily.     Cardiovascular: ARB + Diuretic Combos Failed - 09/14/2021  1:43 PM      Failed - K in normal range and within 180 days    Potassium  Date Value Ref Range Status  12/25/2020 3.4 (L) 3.5 - 5.3 mmol/L Final         Failed - Na in normal range and within 180 days    Sodium  Date Value Ref Range Status  12/25/2020 141 135 - 146 mmol/L Final         Failed - Cr in  normal range and within 180 days    Creat  Date Value Ref Range Status  12/25/2020 0.90 0.50 - 1.03 mg/dL Final         Failed - eGFR is 10 or above and within 180 days    GFR, Est African American  Date Value Ref Range Status  09/15/2019 71 > OR = 60 mL/min/1.59m Final   GFR, Est Non African American  Date Value Ref Range Status  09/15/2019 61 > OR = 60 mL/min/1.731mFinal   eGFR  Date Value Ref Range Status  12/25/2020 76 > OR = 60 mL/min/1.7347minal    Comment:    The eGFR is based on the CKD-EPI 2021 equation. To calculate  the new eGFR from a previous Creatinine or Cystatin C result, go to https://www.kidney.org/professionals/ kdoqi/gfr%5Fcalculator          Passed - Patient is not pregnant      Passed - Last BP in normal range    BP Readings from Last 1 Encounters:  08/20/21 126/84         Passed - Valid encounter within last 6 months    Recent Outpatient Visits          4 weeks ago [facility]multiple sclerosis) ([facility] StagerD   8 months ago Depression, major, in remission ([facility] Center[facility]tee[facility] SizerD   1 year ago Morbid obesity ([facility]  [facility] Center[facility]izerD   2 years ago Morbid obesity ([facility] CHM[facility] CenterwSteele SizerD   2 years ago Encounter for screening mammogram for malignant neoplasm of breast   [facility]      Future Appointments            In 1 month SowSteele SizerD [facility]           . temazepam (RESTORIL) 15 MG capsule 90 capsule 0    Sig: Take 1 capsule (15 mg total) by mouth at bedtime.     Not Delegated - Psychiatry: Anxiolytics/Hypnotics 2 Failed - 09/14/2021  1:43 PM      Failed - This refill cannot be delegated      Failed - Urine Drug Screen completed in last 360 days      Passed - Patient is not pregnant      Passed - Valid encounter within last 6 months    Recent Outpatient Visits          4 weeks ago [facility]multiple sclerosis) ([facility] Center[facility] MesariDrue StagerD   8 months ago Depression, major, in remission ([facility] CHMR[facility] CenterwSteele SizerD   1 year ago Morbid obesity ([facility] CenterwSteele SizerD   2 years ago Morbid obesity ([facility] CHMElmira Medical CenterwSteele SizerD   2 years ago Encounter for screening mammogram for malignant neoplasm of breast   [facility]MK[facility] CenternTowanda MalkinD      Future Appointments            In 1 month SowSteele SizerD [facility]

## 2021-09-18 ENCOUNTER — Other Ambulatory Visit: Payer: Self-pay

## 2021-09-18 DIAGNOSIS — F5101 Primary insomnia: Secondary | ICD-10-CM

## 2021-09-28 ENCOUNTER — Other Ambulatory Visit: Payer: Self-pay | Admitting: Nurse Practitioner

## 2021-09-28 DIAGNOSIS — F5101 Primary insomnia: Secondary | ICD-10-CM

## 2021-09-28 NOTE — Telephone Encounter (Signed)
Requested medication (s) are due for refill today - no  Requested medication (s) are on the active medication list -yes  Future visit scheduled -yes  Last refill: 08/20/21 #90   Notes to clinic: non delegated Rx  Requested Prescriptions  Pending Prescriptions Disp Refills   temazepam (RESTORIL) 15 MG capsule [Pharmacy Med Name: TEMAZEPAM 15MG  CAPSULES] 90 capsule     Sig: TAKE 1 CAPSULE(15 MG) BY MOUTH AT BEDTIME     Not Delegated - Psychiatry: Anxiolytics/Hypnotics 2 Failed - 09/28/2021  3:29 PM      Failed - This refill cannot be delegated      Failed - Urine Drug Screen completed in last 360 days      Passed - Patient is not pregnant      Passed - Valid encounter within last 6 months    Recent Outpatient Visits           1 month ago MS (multiple sclerosis) ([place])   [facility] [place] [place], Drue Stager, MD   9 months ago Depression, major, in remission [facility])   [facility] Steele Sizer, MD   1 year ago Morbid obesity [facility])   [facility] Steele Sizer, MD   2 years ago Morbid obesity [facility])   [facility] Steele Sizer, MD   2 years ago Encounter for screening mammogram for malignant neoplasm of breast   [facility] Towanda Malkin, MD       Future Appointments             In 1 month Ancil Boozer, Drue Stager, MD [facility]               Requested Prescriptions  Pending Prescriptions Disp Refills   temazepam (RESTORIL) 15 MG capsule [Pharmacy Med Name: TEMAZEPAM 15MG  CAPSULES] 90 capsule     Sig: TAKE 1 CAPSULE(15 MG) BY MOUTH AT BEDTIME     Not Delegated - Psychiatry: Anxiolytics/Hypnotics 2 Failed - 09/28/2021  3:29 PM      Failed - This refill cannot be delegated      Failed - Urine Drug Screen completed in last 360 days      Passed - Patient is not pregnant      Passed - Valid encounter within last 6 months    Recent Outpatient  Visits           1 month ago MS (multiple sclerosis) [facility])   [facility] [place], Drue Stager, MD   9 months ago Depression, major, in remission [facility])   [facility] MD   1 year ago Morbid obesity [facility])   [facility] Steele Sizer, MD   2 years ago Morbid obesity [facility])   [facility] Steele Sizer, MD   2 years ago Encounter for screening mammogram for malignant neoplasm of breast   [facility] Towanda Malkin, MD       Future Appointments             In 1 month Steele Sizer, MD [facility]

## 2021-11-05 NOTE — Progress Notes (Unsigned)
Name: LORIANA SAMAD   MRN: [id number]    DOB: [birth year]   Date:11/06/2021       Progress Note  Subjective  Chief Complaint  Chief Complaint  Patient presents with   Annual Exam    HPI  Patient presents for annual CPE.  Diet: cooks at home, drinking more water, being more mindful  Exercise:  not currently, discussed trying to walk 20 minutes 7 days a week  Last Eye Exam: yearly  Last Dental Exam: every 6 months   [facility] Visit from 11/06/2021 in [facility]  AUDIT-C Score 3      Depression: Phq 9 is  negative    11/06/2021    1:26 PM 08/20/2021    3:14 PM 12/25/2020   11:38 AM 05/10/2020   11:38 AM 09/15/2019    9:48 AM  Depression screen PHQ 2/9  Decreased Interest 0 0 3 0 0  Down, Depressed, Hopeless _0 0 0  PHQ - 2 Score _1 0 0  Altered sleeping 0 0 2    Tired, decreased energy 0 3 3    Change in appetite 0 0 0    Feeling bad or failure about yourself  0 1 0    Trouble concentrating 0 0 0    Moving slowly or fidgety/restless 0 0 0    Suicidal thoughts 0 0 0    PHQ-9 Score _2 Difficult doing work/chores Not difficult at all  Somewhat difficult     Hypertension: BP Readings from Last 3 Encounters:  11/06/21 124/82  08/20/21 126/84  12/25/20 128/70   Obesity: Wt Readings from Last 3 Encounters:  11/06/21 240 lb 3.2 oz (109 kg)  08/20/21 246 lb (111.6 kg)  12/25/20 252 lb (114.3 kg)   BMI Readings from Last 3 Encounters:  11/06/21 41.23 kg/m  08/20/21 42.23 kg/m  12/25/20 43.26 kg/m     Vaccines:    Tdap: up to date  Shingrix: 2nd dose today  Pneumonia: up to date  Flu: refused  COVID-19:refused    Hep C Screening: done 2017  STD testing and prevention (HIV/chl/gon/syphilis): N/A Intimate partner violence: negative screen  Sexual History : not in a while  Menstrual History/LMP/Abnormal Bleeding: post-menopausal  Discussed importance of follow up if any post-menopausal bleeding: yes   Incontinence Symptoms: positive for symptoms , only when bladder is really full causes urgency   Breast cancer:  - Last Mammogram: she is due for mammogram, already ordered but she canceled, reminded her to schedule it ASAP - BRCA gene screening: N/A  Osteoporosis Prevention : Discussed high calcium and vitamin D supplementation, weight bearing exercises Bone density :not applicable   Cervical cancer screening: today   Skin cancer: Discussed monitoring for atypical lesions  Colorectal cancer: she is due but did not schedule it yet    Lung cancer:  Low Dose CT Chest recommended if Age 12-80 years, 20 pack-year currently smoking OR have quit w/in 15years. Patient does not qualify for screen   ECG: 2012   Advanced Care Planning: A voluntary discussion about advance care planning including the explanation and discussion of advance directives.  Discussed health care proxy and Living will, and the patient was able to identify a health care proxy as niece Chesley Mires and daughter Anoree .  Patient does not have a living will and power of attorney of health care   Lipids: Lab Results  Component Value Date   CHOL  208 (H) 12/25/2020   CHOL 235 (H) 09/15/2019   CHOL 215 (H) 03/17/2018   Lab Results  Component Value Date   HDL 59 12/25/2020   HDL 73 09/15/2019   HDL 56 03/17/2018   Lab Results  Component Value Date   LDLCALC 128 (H) 12/25/2020   LDLCALC 144 (H) 09/15/2019   LDLCALC 139 (H) 03/17/2018   Lab Results  Component Value Date   TRIG 106 12/25/2020   TRIG 81 09/15/2019   TRIG 101 03/17/2018   Lab Results  Component Value Date   CHOLHDL 3.5 12/25/2020   CHOLHDL 3.2 09/15/2019   CHOLHDL 3.8 03/17/2018   No results found for: "LDLDIRECT"  Glucose: Glucose, Bld  Date Value Ref Range Status  12/25/2020 68 65 - 139 mg/dL Final    Comment:    .        Non-fasting reference interval .   09/15/2019 83 65 - 99 mg/dL Final    Comment:    .            Fasting reference  interval .   03/17/2018 90 65 - 99 mg/dL Final    Comment:    .            Fasting reference interval .     Patient Active Problem List   Diagnosis Date Noted   MS (multiple sclerosis) ([place]) 08/20/2021   Other neutropenia ([place]) 03/17/2018   Depression, major, in remission ([place]) 03/17/2018   Chronic right hip pain 03/17/2018   Morbid obesity ([place]) 09/22/2017   Pre-diabetes 11/19/2015   Obesity, Class III, BMI 40-49.9 (morbid obesity) ([place]) 09/20/2014   Back pain, chronic 09/17/2014   Benign essential HTN 09/17/2014   History of cataract surgery 09/17/2014   Dyslipidemia 09/17/2014   Iridocyclitis 09/17/2014   Osteoarthritis of both knees 09/17/2014   History of menorrhagia 09/17/2014   Insomnia 09/17/2014   Vitamin D deficiency 09/17/2014    Past Surgical History:  Procedure Laterality Date   BREAST SURGERY     reduction   CERVICAL CERCLAGE     DILATION AND CURETTAGE OF UTERUS  2008   EYE SURGERY Bilateral    cataracts   foot suregry      Family History  Problem Relation Age of Onset   Diabetes Mother    Hypertension Mother    Stroke Mother    Cancer Father        lung   Hypertension Father    Cancer Maternal Aunt        breast   Stroke Sister     Social History   Socioeconomic History   Marital status: Single    Spouse name: Not on file   Number of children: 1   Years of education: Not on file   Highest education level: Not on file  Occupational History   Not on file  Tobacco Use   Smoking status: Never   Smokeless tobacco: Never  Vaping Use   Vaping Use: Never used  Substance and Sexual Activity   Alcohol use: Yes    Alcohol/week: 0.0 standard drinks of alcohol    Comment: rarely   Drug use: No   Sexual activity: Not Currently    Birth control/protection: I.U.D.  Other Topics Concern   Not on file  Social History Narrative   Has 1 daughter, she is 25 years old and goes to [employer]. Single   Social Determinants of Health    Financial Resource Strain: Low Risk  (  11/06/2021)   Overall Financial Resource Strain (CARDIA)    Difficulty of Paying Living Expenses: Not very hard  Food Insecurity: No Food Insecurity (11/06/2021)   Hunger Vital Sign    Worried About Running Out of Food in the Last Year: Never true    Ran Out of Food in the Last Year: Never true  Transportation Needs: No Transportation Needs (11/06/2021)   PRAPARE - Hydrologist (Medical): No    Lack of Transportation (Non-Medical): No  Physical Activity: Inactive (11/06/2021)   Exercise Vital Sign    Days of Exercise per Week: 0 days    Minutes of Exercise per Session: 0 min  Stress: No Stress Concern Present (11/06/2021)   [place]    Feeling of Stress : Not at all  Social Connections: Moderately Integrated (11/06/2021)   Social Connection and Isolation Panel [NHANES]    Frequency of Communication with Friends and Family: More than three times a week    Frequency of Social Gatherings with Friends and Family: More than three times a week    Attends Religious Services: More than 4 times per year    Active Member of [employer] or Organizations: Yes    Attends Music therapist: More than 4 times per year    Marital Status: Divorced  Intimate Partner Violence: Not At Risk (11/06/2021)   Humiliation, Afraid, Rape, and Kick questionnaire    Fear of Current or Ex-Partner: No    Emotionally Abused: No    Physically Abused: No    Sexually Abused: No     Current Outpatient Medications:    amLODipine (NORVASC) 5 MG tablet, Take 1 tablet (5 mg total) by mouth daily. TAKE 1 TABLET BY MOUTH DAILY FOR BLOOD PRESSURE, Disp: 90 tablet, Rfl: 1   Cholecalciferol (VITAMIN D3) 50 MCG (2000 UT) capsule, Take 1 capsule (2,000 Units total) by mouth daily., Disp: 100 capsule, Rfl: 1   citalopram (CELEXA) 20 MG tablet, Take 1 tablet (20 mg total) by mouth  daily., Disp: 90 tablet, Rfl: 1   Cyanocobalamin (B-12) 1000 MCG SUBL, Place 1 tablet under the tongue 3 (three) times a week., Disp: 30 tablet, Rfl: 0   olmesartan-hydrochlorothiazide (BENICAR HCT) 40-25 MG tablet, Take 1 tablet by mouth daily., Disp: 90 tablet, Rfl: 1   prednisoLONE acetate (PRED FORTE) 1 % ophthalmic suspension, Place 1 drop into the left eye every 6 (six) hours., Disp: , Rfl:    temazepam (RESTORIL) 15 MG capsule, Take 1 capsule (15 mg total) by mouth at bedtime., Disp: 90 capsule, Rfl: 0  Allergies  Allergen Reactions   Chocolate Hives     ROS  Constitutional: Negative for fever , positive for weight change.  Respiratory: Negative for cough and shortness of breath.   Cardiovascular: Negative for chest pain or palpitations.  Gastrointestinal: Negative for abdominal pain, no bowel changes.  Musculoskeletal: Negative for gait problem or joint swelling.  Skin: Negative for rash.  Neurological: Negative for dizziness or headache.  No other specific complaints in a complete review of systems (except as listed in HPI above).   Objective  Vitals:   11/06/21 1324  BP: 124/82  Pulse: 90  Resp: 16  Temp: 97.7 F (36.5 C)  TempSrc: Oral  SpO2: 98%  Weight: 240 lb 3.2 oz (109 kg)  Height: _0  (1.626 m)    Body mass index is 41.23 kg/m.  Physical Exam  Constitutional: Patient appears well-developed  and well-nourished. No distress.  HENT: Head: Normocephalic and atraumatic. Ears: B TMs ok, no erythema or effusion; Nose: Nose normal. Mouth/Throat: Oropharynx is clear and moist. No oropharyngeal exudate.  Eyes: Conjunctivae and EOM are normal. Pupils are equal, round, and reactive to light. No scleral icterus.  Neck: Normal range of motion. Neck supple. No JVD present. No thyromegaly present.  Cardiovascular: Normal rate, regular rhythm and normal heart sounds.  No murmur heard. No BLE edema. Pulmonary/Chest: Effort normal and breath sounds normal. No  respiratory distress. Abdominal: Soft. Bowel sounds are normal, no distension. There is no tenderness. no masses Breast: no lumps or masses, no nipple discharge or rashes FEMALE GENITALIA:  External genitalia normal External urethra normal Vaginal vault normal without discharge or lesions Cervix normal without discharge or lesions, it bleed with swab  Bimanual exam normal without masses RECTAL: not done  Musculoskeletal: Normal range of motion, no joint effusions. No gross deformities Neurological:decrease in sensation and weakness of right lower leg due to MS Skin: Skin is warm and dry. No rash noted. No erythema.  Psychiatric: Patient has a normal mood and affect. behavior is normal. Judgment and thought content normal.   Fall Risk:    11/06/2021    1:26 PM 08/20/2021    3:14 PM 12/25/2020   11:38 AM 05/10/2020   11:38 AM 09/15/2019    9:48 AM  Fall Risk   Falls in the past year? _0 0 0  Number falls in past yr: _1 0 0  Injury with Fall? 1 0 0 0 0  Risk for fall due to : History of fall(s) No Fall Risks Impaired balance/gait    Follow up Falls prevention discussed;Education provided;Falls evaluation completed Falls prevention discussed Falls prevention discussed       Functional Status Survey: Is the patient deaf or have difficulty hearing?: No Does the patient have difficulty seeing, even when wearing glasses/contacts?: Yes Does the patient have difficulty concentrating, remembering, or making decisions?: No Does the patient have difficulty walking or climbing stairs?: Yes Does the patient have difficulty dressing or bathing?: No Does the patient have difficulty doing errands alone such as visiting a doctor's office or shopping?: No   Assessment & Plan  1. Colon cancer screening  She will call to re-schedule it   2. Cervical cancer screening  - Cytology - PAP  3. Well woman exam  - Lipid panel - CBC with Differential/Platelet - COMPLETE METABOLIC PANEL WITH  GFR - Hemoglobin A1c - Vitamin B12 - VITAMIN D 25 Hydroxy (Vit-D Deficiency, Fractures) - Cytology - PAP  4. B12 deficiency  - Vitamin B12  5. Vitamin D deficiency  - VITAMIN D 25 Hydroxy (Vit-D Deficiency, Fractures)  6. Pre-diabetes  - Hemoglobin A1c  7. Dyslipidemia  - Lipid panel  8. Long-term use of high-risk medication  - CBC with Differential/Platelet - COMPLETE METABOLIC PANEL WITH GFR  9. Breast cancer screening by mammogram  She will try to call again, difficulty taking time off work   47. Need for shingles vaccine  - Varicella-zoster vaccine IM   -USPSTF grade A and B recommendations reviewed with patient; age-appropriate recommendations, preventive care, screening tests, etc discussed and encouraged; healthy living encouraged; see AVS for patient education given to patient -Discussed importance of 150 minutes of physical activity weekly, eat two servings of fish weekly, eat one serving of tree nuts ( cashews, pistachios, pecans, almonds.Marland Kitchen) every other day, eat 6 servings of fruit/vegetables daily and drink plenty  of water and avoid sweet beverages.   -Reviewed Health Maintenance: Yes.

## 2021-11-06 ENCOUNTER — Ambulatory Visit (INDEPENDENT_AMBULATORY_CARE_PROVIDER_SITE_OTHER): Payer: BC Managed Care – PPO | Admitting: Family Medicine

## 2021-11-06 ENCOUNTER — Other Ambulatory Visit: Payer: Self-pay

## 2021-11-06 ENCOUNTER — Other Ambulatory Visit (HOSPITAL_COMMUNITY)
Admission: RE | Admit: 2021-11-06 | Discharge: 2021-11-06 | Disposition: A | Discharge status: Home / Self Care | Payer: BC Managed Care – PPO | Source: Ambulatory Visit | Attending: Family Medicine | Admitting: Family Medicine

## 2021-11-06 ENCOUNTER — Encounter: Payer: Self-pay | Admitting: Family Medicine

## 2021-11-06 VITALS — BP 124/82 | HR 90 | Temp 97.7°F | Resp 16 | Ht 64.0 in | Wt 240.2 lb

## 2021-11-06 DIAGNOSIS — Z79899 Other long term (current) drug therapy: Secondary | ICD-10-CM | POA: Diagnosis not present

## 2021-11-06 DIAGNOSIS — Z124 Encounter for screening for malignant neoplasm of cervix: Secondary | ICD-10-CM

## 2021-11-06 DIAGNOSIS — Z23 Encounter for immunization: Secondary | ICD-10-CM

## 2021-11-06 DIAGNOSIS — E785 Hyperlipidemia, unspecified: Secondary | ICD-10-CM

## 2021-11-06 DIAGNOSIS — Z1211 Encounter for screening for malignant neoplasm of colon: Secondary | ICD-10-CM

## 2021-11-06 DIAGNOSIS — E538 Deficiency of other specified B group vitamins: Secondary | ICD-10-CM

## 2021-11-06 DIAGNOSIS — Z01419 Encounter for gynecological examination (general) (routine) without abnormal findings: Secondary | ICD-10-CM | POA: Insufficient documentation

## 2021-11-06 DIAGNOSIS — R7303 Prediabetes: Secondary | ICD-10-CM

## 2021-11-06 DIAGNOSIS — F5101 Primary insomnia: Secondary | ICD-10-CM

## 2021-11-06 DIAGNOSIS — E559 Vitamin D deficiency, unspecified: Secondary | ICD-10-CM

## 2021-11-06 DIAGNOSIS — Z1231 Encounter for screening mammogram for malignant neoplasm of breast: Secondary | ICD-10-CM

## 2021-11-06 NOTE — Patient Instructions (Signed)
Preventive Care 40-55 Years Old, Female Preventive care refers to lifestyle choices and visits with your health care provider that can promote health and wellness. Preventive care visits are also called wellness exams. What can I expect for my preventive care visit? Counseling Your health care provider may ask you questions about your: Medical history, including: Past medical problems. Family medical history. Pregnancy history. Current health, including: Menstrual cycle. Method of birth control. Emotional well-being. Home life and relationship well-being. Sexual activity and sexual health. Lifestyle, including: Alcohol, nicotine or tobacco, and drug use. Access to firearms. Diet, exercise, and sleep habits. Work and work environment. Sunscreen use. Safety issues such as seatbelt and bike helmet use. Physical exam Your health care provider will check your: Height and weight. These may be used to calculate your BMI (body mass index). BMI is a measurement that tells if you are at a healthy weight. Waist circumference. This measures the distance around your waistline. This measurement also tells if you are at a healthy weight and may help predict your risk of certain diseases, such as type 2 diabetes and high blood pressure. Heart rate and blood pressure. Body temperature. Skin for abnormal spots. What immunizations do I need?  Vaccines are usually given at various ages, according to a schedule. Your health care provider will recommend vaccines for you based on your age, medical history, and lifestyle or other factors, such as travel or where you work. What tests do I need? Screening Your health care provider may recommend screening tests for certain conditions. This may include: Lipid and cholesterol levels. Diabetes screening. This is done by checking your blood sugar (glucose) after you have not eaten for a while (fasting). Pelvic exam and Pap test. Hepatitis B test. Hepatitis C  test. HIV (human immunodeficiency virus) test. STI (sexually transmitted infection) testing, if you are at risk. Lung cancer screening. Colorectal cancer screening. Mammogram. Talk with your health care provider about when you should start having regular mammograms. This may depend on whether you have a family history of breast cancer. BRCA-related cancer screening. This may be done if you have a family history of breast, ovarian, tubal, or peritoneal cancers. Bone density scan. This is done to screen for osteoporosis. Talk with your health care provider about your test results, treatment options, and if necessary, the need for more tests. Follow these instructions at home: Eating and drinking  Eat a diet that includes fresh fruits and vegetables, whole grains, lean protein, and low-fat dairy products. Take vitamin and mineral supplements as recommended by your health care provider. Do not drink alcohol if: Your health care provider tells you not to drink. You are pregnant, may be pregnant, or are planning to become pregnant. If you drink alcohol: Limit how much you have to 0-1 drink a day. Know how much alcohol is in your drink. In the [place] one drink equals one 12 oz bottle of beer (355 mL), one 5 oz glass of wine (148 mL), or one 1 oz glass of hard liquor (44 mL). Lifestyle Brush your teeth every morning and night with fluoride toothpaste. Floss one time each day. Exercise for at least 30 minutes 5 or more days each week. Do not use any products that contain nicotine or tobacco. These products include cigarettes, chewing tobacco, and vaping devices, such as e-cigarettes. If you need help quitting, ask your health care provider. Do not use drugs. If you are sexually active, practice safe sex. Use a condom or other form of protection to   prevent STIs. If you do not wish to become pregnant, use a form of birth control. If you plan to become pregnant, see your health care provider for a  prepregnancy visit. Take aspirin only as told by your health care provider. Make sure that you understand how much to take and what form to take. Work with your health care provider to find out whether it is safe and beneficial for you to take aspirin daily. Find healthy ways to manage stress, such as: Meditation, yoga, or listening to music. Journaling. Talking to a trusted person. Spending time with friends and family. Minimize exposure to UV radiation to reduce your risk of skin cancer. Safety Always wear your seat belt while driving or riding in a vehicle. Do not drive: If you have been drinking alcohol. Do not ride with someone who has been drinking. When you are tired or distracted. While texting. If you have been using any mind-altering substances or drugs. Wear a helmet and other protective equipment during sports activities. If you have firearms in your house, make sure you follow all gun safety procedures. Seek help if you have been physically or sexually abused. What's next? Visit your health care provider once a year for an annual wellness visit. Ask your health care provider how often you should have your eyes and teeth checked. Stay up to date on all vaccines. This information is not intended to replace advice given to you by your health care provider. Make sure you discuss any questions you have with your health care provider. Document Revised: 06/21/2020 Document Reviewed: 06/21/2020 Elsevier Patient Education  [place].

## 2021-11-07 LAB — CBC WITH DIFFERENTIAL/PLATELET
Absolute Monocytes: 573 cells/uL (ref 200–950)
Basophils Absolute: 39 cells/uL (ref 0–200)
Basophils Relative: 0.8 %
Eosinophils Absolute: 98 cells/uL (ref 15–500)
Eosinophils Relative: 2 %
HCT: 43.2 % (ref 35.0–45.0)
Hemoglobin: 15.1 g/dL (ref 11.7–15.5)
Lymphs Abs: 1465 cells/uL (ref 850–3900)
MCH: 29.8 pg (ref 27.0–33.0)
MCHC: 35 g/dL (ref 32.0–36.0)
MCV: 85.4 fL (ref 80.0–100.0)
MPV: 10.7 fL (ref 7.5–12.5)
Monocytes Relative: 11.7 %
Neutro Abs: 2724 cells/uL (ref 1500–7800)
Neutrophils Relative %: 55.6 %
Platelets: 354 10*3/uL (ref 140–400)
RBC: 5.06 10*6/uL (ref 3.80–5.10)
RDW: 13.7 % (ref 11.0–15.0)
Total Lymphocyte: 29.9 %
WBC: 4.9 10*3/uL (ref 3.8–10.8)

## 2021-11-07 LAB — VITAMIN D 25 HYDROXY (VIT D DEFICIENCY, FRACTURES): Vit D, 25-Hydroxy: 30 ng/mL (ref 30–100)

## 2021-11-07 LAB — HEMOGLOBIN A1C
Hgb A1c MFr Bld: 5.7 % of total Hgb — ABNORMAL HIGH (ref <5.7)
Mean Plasma Glucose: 117 mg/dL
eAG (mmol/L): 6.5 mmol/L

## 2021-11-07 LAB — COMPLETE METABOLIC PANEL WITH GFR
AG Ratio: 1.1 (calc) (ref 1.0–2.5)
ALT: 26 U/L (ref 6–29)
AST: 23 U/L (ref 10–35)
Albumin: 4.2 g/dL (ref 3.6–5.1)
Alkaline phosphatase (APISO): 106 U/L (ref 37–153)
BUN: 13 mg/dL (ref 7–25)
CO2: 28 mmol/L (ref 20–32)
Calcium: 9.9 mg/dL (ref 8.6–10.4)
Chloride: 103 mmol/L (ref 98–110)
Creat: 1.01 mg/dL (ref 0.50–1.03)
Globulin: 3.8 g/dL (calc) — ABNORMAL HIGH (ref 1.9–3.7)
Glucose, Bld: 70 mg/dL (ref 65–99)
Potassium: 3.5 mmol/L (ref 3.5–5.3)
Sodium: 140 mmol/L (ref 135–146)
Total Bilirubin: 0.7 mg/dL (ref 0.2–1.2)
Total Protein: 8 g/dL (ref 6.1–8.1)
eGFR: 66 mL/min/{1.73_m2} (ref >=60)

## 2021-11-07 LAB — VITAMIN B12: Vitamin B-12: 516 pg/mL (ref 200–1100)

## 2021-11-07 LAB — LIPID PANEL
Cholesterol: 211 mg/dL — ABNORMAL HIGH (ref <200)
HDL: 60 mg/dL (ref >=50)
LDL Cholesterol (Calc): 129 mg/dL (calc) — ABNORMAL HIGH
Non-HDL Cholesterol (Calc): 151 mg/dL (calc) — ABNORMAL HIGH (ref <130)
Total CHOL/HDL Ratio: 3.5 (calc) (ref <5.0)
Triglycerides: 117 mg/dL (ref <150)

## 2021-11-07 MED ORDER — TEMAZEPAM 15 MG PO CAPS: 15.0000 mg | ORAL_CAPSULE | Freq: Every day | ORAL | 0 refills | Status: DC

## 2021-11-14 LAB — CYTOLOGY - PAP
Comment: NEGATIVE
Diagnosis: NEGATIVE
High risk HPV: NEGATIVE

## 2021-12-05 DIAGNOSIS — E559 Vitamin D deficiency, unspecified: Secondary | ICD-10-CM | POA: Diagnosis not present

## 2021-12-05 DIAGNOSIS — G35 Multiple sclerosis: Secondary | ICD-10-CM | POA: Diagnosis not present

## 2021-12-05 DIAGNOSIS — M21371 Foot drop, right foot: Secondary | ICD-10-CM | POA: Diagnosis not present

## 2021-12-05 DIAGNOSIS — H02403 Unspecified ptosis of bilateral eyelids: Secondary | ICD-10-CM | POA: Diagnosis not present

## 2021-12-05 DIAGNOSIS — Z8669 Personal history of other diseases of the nervous system and sense organs: Secondary | ICD-10-CM | POA: Diagnosis not present

## 2022-03-18 ENCOUNTER — Encounter: Payer: Self-pay | Admitting: Physician Assistant

## 2022-03-18 ENCOUNTER — Other Ambulatory Visit: Payer: Self-pay

## 2022-03-18 ENCOUNTER — Ambulatory Visit: Payer: BC Managed Care – PPO | Admitting: Physician Assistant

## 2022-03-18 DIAGNOSIS — F325 Major depressive disorder, single episode, in full remission: Secondary | ICD-10-CM | POA: Diagnosis not present

## 2022-03-18 DIAGNOSIS — I10 Essential (primary) hypertension: Secondary | ICD-10-CM | POA: Diagnosis not present

## 2022-03-18 DIAGNOSIS — F5101 Primary insomnia: Secondary | ICD-10-CM | POA: Diagnosis not present

## 2022-03-18 MED ORDER — CITALOPRAM HYDROBROMIDE 20 MG PO TABS: 20.0000 mg | ORAL_TABLET | Freq: Every day | ORAL | 1 refills | Status: DC

## 2022-03-18 MED ORDER — AMLODIPINE BESYLATE 5 MG PO TABS: 5.0000 mg | ORAL_TABLET | Freq: Every day | ORAL | 1 refills | Status: DC

## 2022-03-18 MED ORDER — TEMAZEPAM 15 MG PO CAPS: 15.0000 mg | ORAL_CAPSULE | Freq: Every day | ORAL | 0 refills | Status: DC

## 2022-03-18 MED ORDER — OLMESARTAN MEDOXOMIL-HCTZ 40-25 MG PO TABS: 1.0000 | ORAL_TABLET | Freq: Every day | ORAL | 1 refills | Status: DC

## 2022-03-18 NOTE — Assessment & Plan Note (Signed)
Chronic, historic condition Appears stable and responding well to Temazepam 15 mg PO QHS Continue current regimen Follow up as needed for progressing symptoms

## 2022-03-18 NOTE — Assessment & Plan Note (Addendum)
Chronic, historic condition Appears stable and well managed on current regimen: Amlodipine 5 mg PO QD, Olmesartan-HCTZ 40-25 mg PO QD Reports home BP measures are similar to office readings Continue current regimen Follow up  6 months for regular monitoring or  as needed for elevated BP readings or concerns

## 2022-03-18 NOTE — Assessment & Plan Note (Signed)
Chronic, historic condition Appears well managed with Celexa 20 mg PO QD Continue current regimen Refills provided today  Follow up as needed for persistent or progressing symptoms

## 2022-03-18 NOTE — Progress Notes (Signed)
Established Patient Office Visit  Name: Deanna Perkins   MRN: [id number]    DOB: 04/16/1965   Date:03/18/2022  Today's Provider: Talitha Givens, MHS, PA-C Introduced myself to the patient as a PA-C and provided education on APPs in clinical practice.         Subjective  Chief Complaint  Chief Complaint  Patient presents with   Hypertension   Depression   Insomnia    Follow up, medication refills    HPI   Hypertension: - Medications: Olmesartan-HCTZ 40-25 mg PO QD, Amlodipine 5 mg PO QD  - Compliance: Excellent  - Checking BP at home: every now and then - usually in goal range  - Denies any SOB, CP, vision changes, LE edema, medication SEs, or symptoms of hypotension   DEPRESSION Mood status: stable Satisfied with current treatment?: yes Symptom severity: mild  Duration of current treatment : chronic Side effects: no Medication compliance: excellent compliance  psychiatric medications: celexa Depressed mood: no Anxious mood: no Anhedonia: no Significant weight loss or gain: no Insomnia: no    Fatigue: no Feelings of worthlessness or guilt: no Impaired concentration/indecisiveness: no Suicidal ideations: no Hopelessness: no Crying spells: no    03/18/2022   10:10 AM 11/06/2021    1:26 PM 08/20/2021    3:14 PM 12/25/2020   11:38 AM 05/10/2020   11:38 AM  Depression screen PHQ 2/9  Decreased Interest 0 0 0 3 0  Down, Depressed, Hopeless 0 '1 1 3 '$ 0  PHQ - 2 Score 0 '1 1 6 '$ 0  Altered sleeping 2 0 0 2   Tired, decreased energy 1 0 3 3   Change in appetite 0 0 0 0   Feeling bad or failure about yourself  0 0 1 0   Trouble concentrating 0 0 0 0   Moving slowly or fidgety/restless 1 0 0 0   Suicidal thoughts 0 0 0 0   PHQ-9 Score '4 1 5 11   '$ Difficult doing work/chores  Not difficult at all  Somewhat difficult       Patient Active Problem List   Diagnosis Date Noted   MS (multiple sclerosis) ([place]) 08/20/2021   Other neutropenia ([place]) 03/17/2018    Depression, major, in remission ([place]) 03/17/2018   Chronic right hip pain 03/17/2018   Morbid obesity ([place]) 09/22/2017   Pre-diabetes 11/19/2015   Obesity, Class III, BMI 40-49.9 (morbid obesity) ([place]) 09/20/2014   Back pain, chronic 09/17/2014   Benign essential HTN 09/17/2014   History of cataract surgery 09/17/2014   Dyslipidemia 09/17/2014   Iridocyclitis 09/17/2014   Osteoarthritis of both knees 09/17/2014   History of menorrhagia 09/17/2014   Insomnia 09/17/2014   Vitamin D deficiency 09/17/2014    Past Surgical History:  Procedure Laterality Date   BREAST SURGERY     reduction   CERVICAL CERCLAGE     DILATION AND CURETTAGE OF UTERUS  2008   EYE SURGERY Bilateral    cataracts   foot suregry      Family History  Problem Relation Age of Onset   Diabetes Mother    Hypertension Mother    Stroke Mother    Cancer Father        lung   Hypertension Father    Cancer Maternal Aunt        breast   Stroke Sister     Social History   Tobacco Use   Smoking status: Never   Smokeless  tobacco: Never  Substance Use Topics   Alcohol use: Yes    Alcohol/week: 0.0 standard drinks of alcohol    Comment: rarely     Current Outpatient Medications:    Cholecalciferol (VITAMIN D3) 50 MCG (2000 UT) capsule, Take 1 capsule (2,000 Units total) by mouth daily., Disp: 100 capsule, Rfl: 1   Cyanocobalamin (B-12) 1000 MCG SUBL, Place 1 tablet under the tongue 3 (three) times a week., Disp: 30 tablet, Rfl: 0   prednisoLONE acetate (PRED FORTE) 1 % ophthalmic suspension, Place 1 drop into the left eye every 6 (six) hours., Disp: , Rfl:    amLODipine (NORVASC) 5 MG tablet, Take 1 tablet (5 mg total) by mouth daily. TAKE 1 TABLET BY MOUTH DAILY FOR BLOOD PRESSURE, Disp: 90 tablet, Rfl: 1   citalopram (CELEXA) 20 MG tablet, Take 1 tablet (20 mg total) by mouth daily., Disp: 90 tablet, Rfl: 1   olmesartan-hydrochlorothiazide (BENICAR HCT) 40-25 MG tablet, Take 1 tablet by mouth daily., Disp:  90 tablet, Rfl: 1   temazepam (RESTORIL) 15 MG capsule, Take 1 capsule (15 mg total) by mouth at bedtime., Disp: 90 capsule, Rfl: 0  Allergies  Allergen Reactions   Chocolate Hives    I personally reviewed active problem list, medication list, allergies, health maintenance, notes from last encounter, lab results with the patient/caregiver today.   Review of Systems  Constitutional:  Negative for fever, malaise/fatigue and weight loss.  Eyes:  Negative for blurred vision and double vision.  Cardiovascular:  Negative for chest pain, palpitations and leg swelling.  Neurological:  Positive for tingling (chronic). Negative for dizziness, loss of consciousness and headaches.  Psychiatric/Behavioral:  Negative for depression and suicidal ideas. The patient is not nervous/anxious and does not have insomnia.       Objective  Vitals:   03/18/22 1009  BP: 128/82  Pulse: 77  Resp: 16  Temp: 97.7 F (36.5 C)  TempSrc: Oral  SpO2: 98%  Weight: 239 lb 8 oz (108.6 kg)  Height: '5\' 4"'$  (1.626 m)    Body mass index is 41.11 kg/m.  Physical Exam Vitals reviewed.  Constitutional:      General: She is awake.     Appearance: Normal appearance. She is well-developed and well-groomed.  HENT:     Head: Normocephalic and atraumatic.  Cardiovascular:     Rate and Rhythm: Normal rate and regular rhythm.     Pulses: Normal pulses.          Radial pulses are 2+ on the right side and 2+ on the left side.     Heart sounds: Normal heart sounds. No murmur heard.    No friction rub. No gallop.  Pulmonary:     Effort: Pulmonary effort is normal.     Breath sounds: Normal breath sounds. No decreased air movement. No decreased breath sounds, wheezing, rhonchi or rales.  Musculoskeletal:     Right lower leg: No edema.     Left lower leg: No edema.  Neurological:     Mental Status: She is alert.  Psychiatric:        Behavior: Behavior is cooperative.      No results found for this or any  previous visit (from the past 2160 hour(s)).   PHQ2/9:    03/18/2022   10:10 AM 11/06/2021    1:26 PM 08/20/2021    3:14 PM 12/25/2020   11:38 AM 05/10/2020   11:38 AM  Depression screen PHQ 2/9  Decreased Interest 0 0 0  3 0  Down, Depressed, Hopeless 0 '1 1 3 '$ 0  PHQ - 2 Score 0 '1 1 6 '$ 0  Altered sleeping 2 0 0 2   Tired, decreased energy 1 0 3 3   Change in appetite 0 0 0 0   Feeling bad or failure about yourself  0 0 1 0   Trouble concentrating 0 0 0 0   Moving slowly or fidgety/restless 1 0 0 0   Suicidal thoughts 0 0 0 0   PHQ-9 Score '4 1 5 11   '$ Difficult doing work/chores  Not difficult at all  Somewhat difficult       Fall Risk:    03/18/2022   10:10 AM 11/06/2021    1:26 PM 08/20/2021    3:14 PM 12/25/2020   11:38 AM 05/10/2020   11:38 AM  Fall Risk   Falls in the past year? 0 '1 1 1 '$ 0  Number falls in past yr: 0 '1 1 1 '$ 0  Injury with Fall? 0 1 0 0 0  Risk for fall due to :  History of fall(s) No Fall Risks Impaired balance/gait   Follow up  Falls prevention discussed;Education provided;Falls evaluation completed Falls prevention discussed Falls prevention discussed       Functional Status Survey: Is the patient deaf or have difficulty hearing?: Yes Does the patient have difficulty seeing, even when wearing glasses/contacts?: Yes Does the patient have difficulty concentrating, remembering, or making decisions?: No Does the patient have difficulty walking or climbing stairs?: No Does the patient have difficulty dressing or bathing?: Yes Does the patient have difficulty doing errands alone such as visiting a doctor's office or shopping?: Yes    Assessment & Plan  Problem List Items Addressed This Visit       Cardiovascular and Mediastinum   Benign essential HTN    Chronic, historic condition Appears stable and well managed on current regimen: Amlodipine 5 mg PO QD, Olmesartan-HCTZ 40-25 mg PO QD Reports home BP measures are similar to office readings Continue  current regimen Follow up  6 months for regular monitoring or  as needed for elevated BP readings or concerns         Relevant Medications   olmesartan-hydrochlorothiazide (BENICAR HCT) 40-25 MG tablet   amLODipine (NORVASC) 5 MG tablet     Other   Insomnia    Chronic, historic condition Appears stable and responding well to Temazepam 15 mg PO QHS Continue current regimen Follow up as needed for progressing symptoms        Relevant Medications   temazepam (RESTORIL) 15 MG capsule   Depression, major, in remission (H[facility])    Chronic, historic condition Appears well managed with Celexa 20 mg PO QD Continue current regimen Refills provided today  Follow up as needed for persistent or progressing symptoms       Relevant Medications   citalopram (CELEXA) 20 MG tablet     No follow-ups on file.   I, Koben Daman E Osamah Schmader, PA-C, have reviewed all documentation for this visit. The documentation on 03/18/22 for the exam, diagnosis, procedures, and orders are all accurate and complete.   Talitha Givens, MHS, PA-C [facility]

## 2022-06-17 NOTE — Progress Notes (Unsigned)
Name: Deanna Perkins   MRN: [id number]    DOB: Apr 15, 1965   Date:06/18/2022       Progress Note  Subjective  Chief Complaint  Follow Up  HPI  HTN: taking medication daily and denies side effects of medication. No chest pain, palpitation or SOB, denies dizziness . BP is at goal , Tcurrently on Benicar hctz and norvasc daily and doing well    Insomnia:  She takes Temazepam prn, usually every other day, the medication helps her helps her fall and stay asleep for about 7 hours. She needs a refill, last rx lasted about 5 months   MS: diagnosed by Dr. Sherryll Burger June 2023, she is going to PT due to right leg weakness , using a cane, going to see Dr. Sherryll Burger Symptoms going on for years, had seen Ortho, neurosurgeon for right leg pain and finally seen by Dr. Sherryll Burger and diagnosed with MS.  She has ocular symptoms and also right leg weakness and numbness. She is taking Dimethyl Fumarate and states pain improved initially but not working as well now.    Major Depression in Remission she was very depressed from 2008  until 2017. She was diagnosed with MS June 2023 and felt much worse after the diagnosis, hopeless, difficulty focusing, lack of motivation, she has been taking citalopram and is doing okay now but still has down days.    Morbid Obesity: BMI still above 40, but she lost 3 more pounds since last visit, she is on FMLA for MS and leg pain but is intermittent leave . She has drinking more water and will try to go to her daughter's house to exercise in the pool    Pre-diabetes: A1C stable down from 5.8 % to 5.7 %  she denies polyphagia, polydipsia or polyuria. Family history of DM.    Dyslipidemia:   The 10-year ASCVD risk score (Arnett DK, et al., 2019) is: 5.1%   Values used to calculate the score:     Age: 57 years     Sex: Female     Is Non-Hispanic African American: Yes     Diabetic: No     Tobacco smoker: No     Systolic Blood Pressure: 128 mmHg     Is BP treated: Yes     HDL Cholesterol: 60  mg/dL     Total Cholesterol: 211 mg/dL   Patient Active Problem List   Diagnosis Date Noted   MS (multiple sclerosis) ([facility]) 08/20/2021   Other neutropenia ([facility]) 03/17/2018   Depression, major, in remission ([facility]) 03/17/2018   Chronic right hip pain 03/17/2018   Morbid obesity ([facility]) 09/22/2017   Pre-diabetes 11/19/2015   Obesity, Class III, BMI 40-49.9 (morbid obesity) ([facility]) 09/20/2014   Back pain, chronic 09/17/2014   Benign essential HTN 09/17/2014   History of cataract surgery 09/17/2014   Dyslipidemia 09/17/2014   Iridocyclitis 09/17/2014   Osteoarthritis of both knees 09/17/2014   History of menorrhagia 09/17/2014   Insomnia 09/17/2014   Vitamin D deficiency 09/17/2014    Past Surgical History:  Procedure Laterality Date   BREAST SURGERY     reduction   CERVICAL CERCLAGE     DILATION AND CURETTAGE OF UTERUS  2008   EYE SURGERY Bilateral    cataracts   foot suregry      Family History  Problem Relation Age of Onset   Diabetes Mother    Hypertension Mother    Stroke Mother    Cancer Father  lung   Hypertension Father    Cancer Maternal Aunt        breast   Stroke Sister     Social History   Tobacco Use   Smoking status: Never   Smokeless tobacco: Never  Substance Use Topics   Alcohol use: Yes    Alcohol/week: 0.0 standard drinks of alcohol    Comment: rarely     Current Outpatient Medications:    amLODipine (NORVASC) 5 MG tablet, Take 1 tablet (5 mg total) by mouth daily. TAKE 1 TABLET BY MOUTH DAILY FOR BLOOD PRESSURE, Disp: 90 tablet, Rfl: 1   Cholecalciferol (VITAMIN D3) 50 MCG (2000 UT) capsule, Take 1 capsule (2,000 Units total) by mouth daily., Disp: 100 capsule, Rfl: 1   citalopram (CELEXA) 20 MG tablet, Take 1 tablet (20 mg total) by mouth daily., Disp: 90 tablet, Rfl: 1   Cyanocobalamin (B-12) 1000 MCG SUBL, Place 1 tablet under the tongue 3 (three) times a week., Disp: 30 tablet, Rfl: 0   Dimethyl Fumarate 240 MG CPDR, Take 240 mg by  mouth 2 (two) times daily., Disp: , Rfl:    olmesartan-hydrochlorothiazide (BENICAR HCT) 40-25 MG tablet, Take 1 tablet by mouth daily., Disp: 90 tablet, Rfl: 1   prednisoLONE acetate (PRED FORTE) 1 % ophthalmic suspension, Place 1 drop into the left eye every 6 (six) hours., Disp: , Rfl:    temazepam (RESTORIL) 15 MG capsule, Take 1 capsule (15 mg total) by mouth at bedtime., Disp: 90 capsule, Rfl: 0  Allergies  Allergen Reactions   Chocolate Hives    I personally reviewed active problem list, medication list, allergies, family history, social history, health maintenance with the patient/caregiver today.   ROS  Constitutional: Negative for fever or weight change.  Respiratory: Negative for cough and shortness of breath.   Cardiovascular: Negative for chest pain or palpitations.  Gastrointestinal: Negative for abdominal pain, no bowel changes.  Musculoskeletal: Negative for gait problem or joint swelling.  Skin: Negative for rash.  Neurological: Negative for dizziness or headache.  No other specific complaints in a complete review of systems (except as listed in HPI above).   Objective  Vitals:   06/18/22 1416  BP: 128/72  Pulse: 91  Resp: 16  SpO2: 98%  Weight: 236 lb (107 kg)  Height: 5\' 3"  (1.6 m)    Body mass index is 41.81 kg/m.  Physical Exam  Constitutional: Patient appears well-developed and well-nourished. Obese  No distress.  HEENT: head atraumatic, normocephalic, pupils equal and reactive to light, neck supple Cardiovascular: Normal rate, regular rhythm and normal heart sounds.  No murmur heard. No BLE edema. Pulmonary/Chest: Effort normal and breath sounds normal. No respiratory distress. Abdominal: Soft.  There is no tenderness. Psychiatric: Patient has a normal mood and affect. behavior is normal. Judgment and thought content normal.   PHQ2/9:    06/18/2022    2:16 PM 03/18/2022   10:10 AM 11/06/2021    1:26 PM 08/20/2021    3:14 PM 12/25/2020   11:38  AM  Depression screen PHQ 2/9  Decreased Interest 0 0 0 0 3  Down, Depressed, Hopeless 0 0 1 1 3   PHQ - 2 Score 0 0 1 1 6   Altered sleeping 0 2 0 0 2  Tired, decreased energy 0 1 0 3 3  Change in appetite 0 0 0 0 0  Feeling bad or failure about yourself  0 0 0 1 0  Trouble concentrating 0 0 0 0 0  Moving  slowly or fidgety/restless 0 1 0 0 0  Suicidal thoughts 0 0 0 0 0  PHQ-9 Score 0 4 1 5 11   Difficult doing work/chores   Not difficult at all  Somewhat difficult    phq 9 is negative   Fall Risk:    06/18/2022    2:16 PM 03/18/2022   10:10 AM 11/06/2021    1:26 PM 08/20/2021    3:14 PM 12/25/2020   11:38 AM  Fall Risk   Falls in the past year? 0 0 1 1 1   Number falls in past yr: 0 0 1 1 1   Injury with Fall? 0 0 1 0 0  Risk for fall due to : Impaired balance/gait;Impaired mobility  History of fall(s) No Fall Risks Impaired balance/gait  Follow up Falls prevention discussed  Falls prevention discussed;Education provided;Falls evaluation completed Falls prevention discussed Falls prevention discussed      Functional Status Survey: Is the patient deaf or have difficulty hearing?: No Does the patient have difficulty seeing, even when wearing glasses/contacts?: Yes Does the patient have difficulty concentrating, remembering, or making decisions?: Yes Does the patient have difficulty walking or climbing stairs?: Yes Does the patient have difficulty dressing or bathing?: No Does the patient have difficulty doing errands alone such as visiting a doctor's office or shopping?: Yes    Assessment & Plan  1. MS (multiple sclerosis) ([facility]CC)  Under the care of Dr. Sherryll Burger   2. Morbid obesity (HCC)  Discussed with the patient the risk posed by an increased BMI. Discussed importance of portion control, calorie counting and at least 150 minutes of physical activity weekly. Avoid sweet beverages and drink more water. Eat at least 6 servings of fruit and vegetables daily    3. Depression,  major, in remission (HCC)  - citalopram (CELEXA) 20 MG tablet; Take 1 tablet (20 mg total) by mouth daily.  Dispense: 90 tablet; Refill: 1  4. Pre-diabetes  On life style modification   5. B12 deficiency  Continue supplementation   6. Dyslipidemia  Last LDL was better   7. Benign essential HTN  - olmesartan-hydrochlorothiazide (BENICAR HCT) 40-25 MG tablet; Take 1 tablet by mouth daily.  Dispense: 90 tablet; Refill: 1 - amLODipine (NORVASC) 5 MG tablet; Take 1 tablet (5 mg total) by mouth daily. TAKE 1 TABLET BY MOUTH DAILY FOR BLOOD PRESSURE  Dispense: 90 tablet; Refill: 1  8. Breast cancer screening by mammogram  - MM 3D SCREENING MAMMOGRAM BILATERAL BREAST; Future  9. Primary insomnia  - temazepam (RESTORIL) 15 MG capsule; Take 1 capsule (15 mg total) by mouth at bedtime.  Dispense: 90 capsule; Refill: 0  10. Colon cancer screening  - Ambulatory referral to Gastroenterology

## 2022-06-18 ENCOUNTER — Ambulatory Visit: Payer: BC Managed Care – PPO | Admitting: Family Medicine

## 2022-06-18 ENCOUNTER — Encounter: Payer: Self-pay | Admitting: Family Medicine

## 2022-06-18 VITALS — BP 128/72 | HR 91 | Resp 16 | Ht 63.0 in | Wt 236.0 lb

## 2022-06-18 DIAGNOSIS — F325 Major depressive disorder, single episode, in full remission: Secondary | ICD-10-CM

## 2022-06-18 DIAGNOSIS — Z1231 Encounter for screening mammogram for malignant neoplasm of breast: Secondary | ICD-10-CM

## 2022-06-18 DIAGNOSIS — R7303 Prediabetes: Secondary | ICD-10-CM

## 2022-06-18 DIAGNOSIS — Z1211 Encounter for screening for malignant neoplasm of colon: Secondary | ICD-10-CM

## 2022-06-18 DIAGNOSIS — G35D Multiple sclerosis, unspecified: Secondary | ICD-10-CM

## 2022-06-18 DIAGNOSIS — E538 Deficiency of other specified B group vitamins: Secondary | ICD-10-CM

## 2022-06-18 DIAGNOSIS — G35 Multiple sclerosis: Secondary | ICD-10-CM | POA: Diagnosis not present

## 2022-06-18 DIAGNOSIS — I10 Essential (primary) hypertension: Secondary | ICD-10-CM

## 2022-06-18 DIAGNOSIS — E785 Hyperlipidemia, unspecified: Secondary | ICD-10-CM

## 2022-06-18 DIAGNOSIS — F5101 Primary insomnia: Secondary | ICD-10-CM

## 2022-06-18 MED ORDER — CITALOPRAM HYDROBROMIDE 20 MG PO TABS: 20.0000 mg | ORAL_TABLET | Freq: Every day | ORAL | 1 refills | Status: DC

## 2022-06-18 MED ORDER — TEMAZEPAM 15 MG PO CAPS
15.0000 mg | ORAL_CAPSULE | Freq: Every day | ORAL | 0 refills | Status: DC
Start: 2022-06-18 — End: 2022-11-01

## 2022-06-18 MED ORDER — AMLODIPINE BESYLATE 5 MG PO TABS: 5.0000 mg | ORAL_TABLET | Freq: Every day | ORAL | 1 refills | Status: DC

## 2022-06-18 MED ORDER — OLMESARTAN MEDOXOMIL-HCTZ 40-25 MG PO TABS
1.0000 | ORAL_TABLET | Freq: Every day | ORAL | 1 refills | Status: DC
Start: 2022-06-18 — End: 2022-11-01

## 2022-06-18 NOTE — Patient Instructions (Addendum)
Mammogram: Thurs. 06/20/22 at 1:20pm. You can call [facility] at [phone] if you need to change the appointment date or time.

## 2022-06-27 ENCOUNTER — Encounter: Payer: Self-pay | Admitting: *Deleted

## 2022-07-04 DIAGNOSIS — E559 Vitamin D deficiency, unspecified: Secondary | ICD-10-CM | POA: Diagnosis not present

## 2022-07-04 DIAGNOSIS — M25559 Pain in unspecified hip: Secondary | ICD-10-CM | POA: Diagnosis not present

## 2022-07-04 DIAGNOSIS — G35 Multiple sclerosis: Secondary | ICD-10-CM | POA: Diagnosis not present

## 2022-07-04 DIAGNOSIS — M545 Low back pain, unspecified: Secondary | ICD-10-CM | POA: Diagnosis not present

## 2022-07-05 ENCOUNTER — Other Ambulatory Visit: Payer: Self-pay | Admitting: Neurology

## 2022-07-05 DIAGNOSIS — G35 Multiple sclerosis: Secondary | ICD-10-CM

## 2022-07-26 ENCOUNTER — Ambulatory Visit
Admission: RE | Admit: 2022-07-26 | Discharge: 2022-07-26 | Disposition: A | Discharge status: Home / Self Care | Payer: BC Managed Care – PPO | Source: Ambulatory Visit | Attending: Neurology | Admitting: Neurology

## 2022-07-26 DIAGNOSIS — G35 Multiple sclerosis: Secondary | ICD-10-CM

## 2022-07-26 MED ORDER — GADOPICLENOL 0.5 MMOL/ML IV SOLN
10.0000 mL | Freq: Once | INTRAVENOUS | Status: AC | PRN
  Administered 2022-07-26: 10 mL via INTRAVENOUS

## 2022-07-31 ENCOUNTER — Other Ambulatory Visit: Payer: Self-pay | Admitting: Neurology

## 2022-07-31 DIAGNOSIS — G35 Multiple sclerosis: Secondary | ICD-10-CM

## 2022-10-31 NOTE — Progress Notes (Signed)
Name: Deanna Perkins   MRN: [id number]    DOB: 01/26/1965   Date:11/01/2022       Progress Note  Subjective  Chief Complaint  Follow Up  HPI  HTN: taking medication daily and denies side effects of medication. No chest pain, palpitation or SOB, denies dizziness . BP is at goal ,currently on Benicar hctz and norvasc daily and BP is at goal    Insomnia:  She takes Temazepam prn, usually every other day, the medication helps her helps her fall and stay asleep for about 7 hours. Her daughter moved back in and due to the change in the environment she has been taking the medication every night   MS: diagnosed by Dr. Sherryll Burger June 2023, she is going to PT due to right leg weakness , using a cane, going to see Dr. Sherryll Burger Symptoms going on for years, had seen Ortho, neurosurgeon for right leg pain and finally seen by Dr. Sherryll Burger and diagnosed with MS.  She has ocular symptoms and also right leg weakness and numbness, she has noticed increase in pain , also noticing weakness of right hand and some intermittent swelling.  She is taking Dimethyl Fumarate/Tacfidera but does not seem to be helping that much. She still has problems with her gait/spastic on right side. She has FMLA so she can take time off for flares but also for doctors visits    Major Depression in Remission she was very depressed from 2008  until 2017. She was diagnosed with MS June 2023 and felt much worse after the diagnosis, hopeless, difficulty focusing, lack of motivation, she has been taking citalopram and symptoms improved. She is having a lot more pain so we wean her off citalopram and add duloxetine   Morbid Obesity: BMI still above 40, but she has lost 20 lbs in the past 2 years but drinking more water, avoiding eating out as often and being more mindful about her diet. She has not been very active. She has noticed increase in pain secondary to MS   Pre-diabetes: A1C stable down from 5.8 % to 5.7 %  she denies polyphagia, polydipsia or  polyuria. Family history of DM. We will recheck it today    Dyslipidemia: we will continue to monitor    The 10-year ASCVD risk score (Arnett DK, et al., 2019) is: 5.1%   Values used to calculate the score:     Age: 57 years     Sex: Female     Is Non-Hispanic African American: Yes     Diabetic: No     Tobacco smoker: No     Systolic Blood Pressure: 128 mmHg     Is BP treated: Yes     HDL Cholesterol: 60 mg/dL     Total Cholesterol: 211 mg/dL   E95 deficiency and vitamin D deficiency: continue supplementation   Patient Active Problem List   Diagnosis Date Noted   MS (multiple sclerosis) (HCC) 08/20/2021   Other neutropenia (HCC) 03/17/2018   Depression, major, in remission (HCC) 03/17/2018   Chronic right hip pain 03/17/2018   Morbid obesity (HCC) 09/22/2017   Pre-diabetes 11/19/2015   Obesity, Class III, BMI 40-49.9 (morbid obesity) (HCC) 09/20/2014   Back pain, chronic 09/17/2014   Benign essential HTN 09/17/2014   History of cataract surgery 09/17/2014   Dyslipidemia 09/17/2014   Iridocyclitis 09/17/2014   Osteoarthritis of both knees 09/17/2014   History of menorrhagia 09/17/2014   Insomnia 09/17/2014   Vitamin D deficiency 09/17/2014  Past Surgical History:  Procedure Laterality Date   BREAST SURGERY     reduction   CERVICAL CERCLAGE     DILATION AND CURETTAGE OF UTERUS  2008   EYE SURGERY Bilateral    cataracts   foot suregry      Family History  Problem Relation Age of Onset   Diabetes Mother    Hypertension Mother    Stroke Mother    Cancer Father        lung   Hypertension Father    Cancer Maternal Aunt        breast   Stroke Sister     Social History   Tobacco Use   Smoking status: Never   Smokeless tobacco: Never  Substance Use Topics   Alcohol use: Yes    Alcohol/week: 0.0 standard drinks of alcohol    Comment: rarely     Current Outpatient Medications:    Dimethyl Fumarate 240 MG CPDR, Take 240 mg by mouth daily at 2 PM., Disp:  , Rfl:    DULoxetine (CYMBALTA) 30 MG capsule, Take 1 capsule (30 mg total) by mouth daily., Disp: 30 capsule, Rfl: 0   DULoxetine (CYMBALTA) 60 MG capsule, Take 1 capsule (60 mg total) by mouth daily., Disp: 90 capsule, Rfl: 0   amLODipine (NORVASC) 5 MG tablet, Take 1 tablet (5 mg total) by mouth daily. TAKE 1 TABLET BY MOUTH DAILY FOR BLOOD PRESSURE, Disp: 90 tablet, Rfl: 0   Cholecalciferol (VITAMIN D3) 50 MCG (2000 UT) capsule, Take 1 capsule (2,000 Units total) by mouth daily., Disp: 100 capsule, Rfl: 1   Cyanocobalamin (B-12) 1000 MCG SUBL, Place 1 tablet under the tongue 3 (three) times a week., Disp: 30 tablet, Rfl: 0   olmesartan-hydrochlorothiazide (BENICAR HCT) 40-25 MG tablet, Take 1 tablet by mouth daily., Disp: 90 tablet, Rfl: 0   temazepam (RESTORIL) 15 MG capsule, Take 1 capsule (15 mg total) by mouth at bedtime., Disp: 90 capsule, Rfl: 0  Allergies  Allergen Reactions   Chocolate Hives    I personally reviewed active problem list, medication list, allergies, family history, social history, health maintenance with the patient/caregiver today.   ROS  Ten systems reviewed and is negative except as mentioned in HPI    Objective  Vitals:   11/01/22 1454  BP: 128/84  Pulse: 95  Resp: 16  SpO2: 98%  Weight: 232 lb (105.2 kg)  Height: 5\' 3"  (1.6 m)    Body mass index is 41.1 kg/m.  Physical Exam  Constitutional: Patient appears well-developed and well-nourished. Obese  No distress.  HEENT: head atraumatic, normocephalic, pupils equal and reactive to light, neck supple Cardiovascular: Normal rate, regular rhythm and normal heart sounds.  No murmur heard. No BLE edema. Pulmonary/Chest: Effort normal and breath sounds normal. No respiratory distress. Abdominal: Soft.  There is no tenderness. Muscular / neuro: intermittent right hand weakness, right leg weakness , spastic walk  Psychiatric: Patient has a normal mood and affect. behavior is normal. Judgment and  thought content normal.   PHQ2/9:    11/01/2022    2:43 PM 06/18/2022    2:16 PM 03/18/2022   10:10 AM 11/06/2021    1:26 PM 08/20/2021    3:14 PM  Depression screen PHQ 2/9  Decreased Interest 0 0 0 0 0  Down, Depressed, Hopeless 0 0 0 1 1  PHQ - 2 Score 0 0 0 1 1  Altered sleeping 0 0 2 0 0  Tired, decreased energy 1 0 1  0 3  Change in appetite 0 0 0 0 0  Feeling bad or failure about yourself  1 0 0 0 1  Trouble concentrating 0 0 0 0 0  Moving slowly or fidgety/restless 0 0 1 0 0  Suicidal thoughts 0 0 0 0 0  PHQ-9 Score 2 0 4 1 5   Difficult doing work/chores    Not difficult at all     phq 9 is negative   Fall Risk:    11/01/2022    2:43 PM 06/18/2022    2:16 PM 03/18/2022   10:10 AM 11/06/2021    1:26 PM 08/20/2021    3:14 PM  Fall Risk   Falls in the past year? 0 0 0 1 1  Number falls in past yr: 0 0 0 1 1  Injury with Fall? 0 0 0 1 0  Risk for fall due to : No Fall Risks Impaired balance/gait;Impaired mobility  History of fall(s) No Fall Risks  Follow up Falls prevention discussed Falls prevention discussed  Falls prevention discussed;Education provided;Falls evaluation completed Falls prevention discussed      Functional Status Survey: Is the patient deaf or have difficulty hearing?: No Does the patient have difficulty seeing, even when wearing glasses/contacts?: No Does the patient have difficulty concentrating, remembering, or making decisions?: No Does the patient have difficulty walking or climbing stairs?: Yes Does the patient have difficulty dressing or bathing?: No Does the patient have difficulty doing errands alone such as visiting a doctor's office or shopping?: No    Assessment & Plan  1. MS (multiple sclerosis) (HCC)  Having more symptoms, she will discuss it with Dr. Sherryll Burger, we will change from Celexa to Duloxetine to help with pain   2. Morbid obesity (HCC)  Insurance does not pay for weight loss medications, she has been drinking more water  and has lost some weight   3. Depression, major, in remission (HCC)  We will switch to Duloxetin to improve pain   4. Pre-diabetes  - Hemoglobin A1c  5. Benign essential HTN  - CBC with Differential/Platelet - COMPLETE METABOLIC PANEL WITH GFR - olmesartan-hydrochlorothiazide (BENICAR HCT) 40-25 MG tablet; Take 1 tablet by mouth daily.  Dispense: 90 tablet; Refill: 0 - amLODipine (NORVASC) 5 MG tablet; Take 1 tablet (5 mg total) by mouth daily. TAKE 1 TABLET BY MOUTH DAILY FOR BLOOD PRESSURE  Dispense: 90 tablet; Refill: 0  6. B12 deficiency  - B12 and Folate Panel  7. Dyslipidemia  - Lipid panel  8. Primary insomnia  - temazepam (RESTORIL) 15 MG capsule; Take 1 capsule (15 mg total) by mouth at bedtime.  Dispense: 90 capsule; Refill: 0  9. Vitamin D deficiency  - VITAMIN D 25 Hydroxy (Vit-D Deficiency, Fractures)

## 2022-11-01 ENCOUNTER — Encounter: Payer: Self-pay | Admitting: Family Medicine

## 2022-11-01 ENCOUNTER — Ambulatory Visit: Payer: BC Managed Care – PPO | Admitting: Family Medicine

## 2022-11-01 VITALS — BP 128/84 | HR 95 | Resp 16 | Ht 63.0 in | Wt 232.0 lb

## 2022-11-01 DIAGNOSIS — F5101 Primary insomnia: Secondary | ICD-10-CM

## 2022-11-01 DIAGNOSIS — E559 Vitamin D deficiency, unspecified: Secondary | ICD-10-CM | POA: Diagnosis not present

## 2022-11-01 DIAGNOSIS — E785 Hyperlipidemia, unspecified: Secondary | ICD-10-CM

## 2022-11-01 DIAGNOSIS — I10 Essential (primary) hypertension: Secondary | ICD-10-CM | POA: Diagnosis not present

## 2022-11-01 DIAGNOSIS — G35 Multiple sclerosis: Secondary | ICD-10-CM | POA: Diagnosis not present

## 2022-11-01 DIAGNOSIS — F325 Major depressive disorder, single episode, in full remission: Secondary | ICD-10-CM | POA: Diagnosis not present

## 2022-11-01 DIAGNOSIS — R7303 Prediabetes: Secondary | ICD-10-CM | POA: Diagnosis not present

## 2022-11-01 DIAGNOSIS — E538 Deficiency of other specified B group vitamins: Secondary | ICD-10-CM | POA: Diagnosis not present

## 2022-11-01 MED ORDER — TEMAZEPAM 15 MG PO CAPS
15.0000 mg | ORAL_CAPSULE | Freq: Every day | ORAL | 0 refills | Status: DC
Start: 2022-11-01 — End: 2023-03-04

## 2022-11-01 MED ORDER — DULOXETINE HCL 30 MG PO CPEP: 30.0000 mg | ORAL_CAPSULE | Freq: Every day | ORAL | 0 refills | Status: DC

## 2022-11-01 MED ORDER — AMLODIPINE BESYLATE 5 MG PO TABS
5.0000 mg | ORAL_TABLET | Freq: Every day | ORAL | 0 refills | Status: DC
Start: 2022-11-01 — End: 2023-02-14

## 2022-11-01 MED ORDER — OLMESARTAN MEDOXOMIL-HCTZ 40-25 MG PO TABS
1.0000 | ORAL_TABLET | Freq: Every day | ORAL | 0 refills | Status: DC
Start: 2022-11-01 — End: 2023-02-14

## 2022-11-01 MED ORDER — DULOXETINE HCL 60 MG PO CPEP: 60.0000 mg | ORAL_CAPSULE | Freq: Every day | ORAL | 0 refills | Status: DC

## 2022-11-01 NOTE — Patient Instructions (Signed)
Take half pills of celexa 20 mg with one capsule of Duloxetine 30 mg for one month, after stop Citalopram and only take Duloxetine 60 mg ( new rx)

## 2022-11-02 LAB — CBC WITH DIFFERENTIAL/PLATELET
Absolute Lymphocytes: 1542 {cells}/uL (ref 850–3900)
Absolute Monocytes: 550 {cells}/uL (ref 200–950)
Basophils Absolute: 52 {cells}/uL (ref 0–200)
Basophils Relative: 1.1 %
Eosinophils Absolute: 108 {cells}/uL (ref 15–500)
Eosinophils Relative: 2.3 %
HCT: 42.2 % (ref 35.0–45.0)
Hemoglobin: 14.5 g/dL (ref 11.7–15.5)
MCH: 30.4 pg (ref 27.0–33.0)
MCHC: 34.4 g/dL (ref 32.0–36.0)
MCV: 88.5 fL (ref 80.0–100.0)
MPV: 10.8 fL (ref 7.5–12.5)
Monocytes Relative: 11.7 %
Neutro Abs: 2449 {cells}/uL (ref 1500–7800)
Neutrophils Relative %: 52.1 %
Platelets: 313 10*3/uL (ref 140–400)
RBC: 4.77 10*6/uL (ref 3.80–5.10)
RDW: 13.5 % (ref 11.0–15.0)
Total Lymphocyte: 32.8 %
WBC: 4.7 10*3/uL (ref 3.8–10.8)

## 2022-11-02 LAB — VITAMIN D 25 HYDROXY (VIT D DEFICIENCY, FRACTURES): Vit D, 25-Hydroxy: 11 ng/mL — ABNORMAL LOW (ref 30–100)

## 2022-11-02 LAB — COMPLETE METABOLIC PANEL WITH GFR
AG Ratio: 1.2 (calc) (ref 1.0–2.5)
ALT: 37 U/L — ABNORMAL HIGH (ref 6–29)
AST: 30 U/L (ref 10–35)
Albumin: 4.2 g/dL (ref 3.6–5.1)
Alkaline phosphatase (APISO): 91 U/L (ref 37–153)
BUN: 12 mg/dL (ref 7–25)
CO2: 31 mmol/L (ref 20–32)
Calcium: 9.5 mg/dL (ref 8.6–10.4)
Chloride: 102 mmol/L (ref 98–110)
Creat: 0.95 mg/dL (ref 0.50–1.03)
Globulin: 3.4 g/dL (ref 1.9–3.7)
Glucose, Bld: 79 mg/dL (ref 65–99)
Potassium: 3.7 mmol/L (ref 3.5–5.3)
Sodium: 140 mmol/L (ref 135–146)
Total Bilirubin: 0.8 mg/dL (ref 0.2–1.2)
Total Protein: 7.6 g/dL (ref 6.1–8.1)
eGFR: 70 mL/min/{1.73_m2} (ref >=60)

## 2022-11-02 LAB — LIPID PANEL
Cholesterol: 210 mg/dL — ABNORMAL HIGH (ref <200)
HDL: 68 mg/dL (ref >=50)
LDL Cholesterol (Calc): 121 mg/dL — ABNORMAL HIGH
Non-HDL Cholesterol (Calc): 142 mg/dL — ABNORMAL HIGH (ref <130)
Total CHOL/HDL Ratio: 3.1 (calc) (ref <5.0)
Triglycerides: 99 mg/dL (ref <150)

## 2022-11-02 LAB — HEMOGLOBIN A1C
Hgb A1c MFr Bld: 5.6 %{Hb} (ref <5.7)
Mean Plasma Glucose: 114 mg/dL
eAG (mmol/L): 6.3 mmol/L

## 2022-11-02 LAB — B12 AND FOLATE PANEL
Folate: 6.8 ng/mL
Vitamin B-12: 433 pg/mL (ref 200–1100)

## 2023-01-06 ENCOUNTER — Encounter: Payer: Self-pay | Admitting: Physician Assistant

## 2023-01-06 ENCOUNTER — Telehealth: Payer: BC Managed Care – PPO | Admitting: Physician Assistant

## 2023-01-06 ENCOUNTER — Telehealth (INDEPENDENT_AMBULATORY_CARE_PROVIDER_SITE_OTHER): Payer: BC Managed Care – PPO | Admitting: Physician Assistant

## 2023-01-06 VITALS — Temp 98.9°F

## 2023-01-06 DIAGNOSIS — J069 Acute upper respiratory infection, unspecified: Secondary | ICD-10-CM | POA: Diagnosis not present

## 2023-01-06 MED ORDER — BENZONATATE 100 MG PO CAPS
100.0000 mg | ORAL_CAPSULE | Freq: Two times a day (BID) | ORAL | 0 refills | Status: DC | PRN
Start: 2023-01-06 — End: 2023-03-04

## 2023-01-06 NOTE — Progress Notes (Signed)
Virtual Visit via Video Note  I connected with Deanna Perkins on 01/06/23 at  2:40 PM EST by a video enabled telemedicine application and verified that I am speaking with the correct person using two identifiers.  Today's Provider: Jacquelin Hawking, MHS, PA-C Introduced myself to the patient as a PA-C and provided education on APPs in clinical practice.   Location: Patient: at home  Provider: [facility], [place]    I discussed the limitations of evaluation and management by telemedicine and the availability of in person appointments. The patient expressed understanding and agreed to proceed.   Chief Complaint  Patient presents with   Cough    Was productive, hasn't been today   Generalized Body Aches    x4 days    History of Present Illness:  She reports she is having congestion, body aches, coughing  She reports Sat and yesterday she had productive coughing but today it has been dry  Duration: she reports her symptoms started on Friday She reports she did have some ear pain on Sat but this has resolved She thinks her symptoms are getting better overall   Recent sick contacts: she denies known recent sick contacts or recent travel Interventions:  Alkaseltzer plus then switched to Theraflu and robitussin   COVID testing at home: she has not tested for COVID at home        Review of Systems  Constitutional:  Positive for malaise/fatigue. Negative for chills and fever.  HENT:  Positive for congestion and sinus pain. Negative for ear pain and sore throat.   Respiratory:  Positive for cough. Negative for shortness of breath and wheezing.   Gastrointestinal:  Positive for vomiting (single episode). Negative for diarrhea, heartburn and nausea.  Musculoskeletal:  Positive for myalgias.  Neurological:  Positive for headaches. Negative for dizziness.     Observations/Objective:  Due to the nature of the virtual visit, physical exam and observations are  limited. Able to obtain the following observations:   Alert, oriented,x3 Appears comfortable, in no acute distress.  No scleral injection, no appreciated hoarseness, tachypnea, wheeze or strider. Able to maintain conversation without visible strain.  No cough appreciated during visit.   Assessment and Plan:  Problem List Items Addressed This Visit   None Visit Diagnoses       Viral upper respiratory tract infection    -  Primary   Relevant Medications   benzonatate (TESSALON) 100 MG capsule      Acute, new concern Visit with patient indicates symptoms comprised of congestion, coughing, myalgias, sinus pressure since Friday congruent with acute URI that is likely viral in nature  Patient is outside therapeutic window for antivirals- no flu or COVID testing performed today.  Due to nature and duration of symptoms recommended treatment regimen is symptomatic relief and follow up if needed Discussed with patient the various viral and bacterial etiologies of current illness and appropriate course of treatment Discussed OTC medication options for multisymptom relief such as Dayquil/Nyquil, Theraflu, AlkaSeltzer, etc. Will send in Tessalon pearls to assist with coughing  Discussed return precautions if symptoms are not improving or worsen over next 5-7 days.      Follow Up Instructions:    I discussed the assessment and treatment plan with the patient. The patient was provided an opportunity to ask questions and all were answered. The patient agreed with the plan and demonstrated an understanding of the instructions.   The patient was advised to call back or seek an in-person  evaluation if the symptoms worsen or if the condition fails to improve as anticipated.  I provided 8 minutes of non-face-to-face time during this encounter.   No follow-ups on file.   I, Parissa Chiao E Annaliese Saez, PA-C, have reviewed all documentation for this visit. The documentation on 01/06/23 for the exam,  diagnosis, procedures, and orders are all accurate and complete.   Jacquelin Hawking, MHS, PA-C [facility]

## 2023-01-06 NOTE — Patient Instructions (Signed)
Based on your described symptoms and the duration of symptoms it is likely that you have a viral upper respiratory infection (often called a "cold")  Symptoms can last for 3-10 days with lingering cough and intermittent symptoms lasting weeks after that.  The goal of treatment at this time is to reduce your symptoms and discomfort   I have sent in Tessalon pearls for you to take twice per day to help with your cough  You can use over the counter medications such as Dayquil/Nyquil, AlkaSeltzer formulations, etc to provide further relief of symptoms according to the manufacturer's instructions  If preferred you can use Coricidin to manage your symptoms rather than those medications mentioned above.    If your symptoms do not improve or become worse in the next 5-7 days please make an apt at the office so we can see you  Go to the ER if you begin to have more serious symptoms such as shortness of breath, trouble breathing, loss of consciousness, swelling around the eyes, high fever, severe lasting headaches, vision changes or neck pain/stiffness.   

## 2023-02-14 ENCOUNTER — Other Ambulatory Visit: Payer: Self-pay | Admitting: Family Medicine

## 2023-02-14 DIAGNOSIS — I10 Essential (primary) hypertension: Secondary | ICD-10-CM

## 2023-02-14 NOTE — Telephone Encounter (Signed)
 Medication Refill -  Most Recent Primary Care Visit:  Provider: MECUM, ERIN E  Department: [id number]  Visit Type: MYCHART VIDEO VISIT  Date: 01/06/2023  Medication: amLODipine  (NORVASC ) 5 MG tablet [[id number]]   olmesartan -hydrochlorothiazide (BENICAR  HCT) 40-25 MG tablet [[id number]]   Has the patient contacted their pharmacy? Yes (Agent: If no, request that the patient contact the pharmacy for the refill. If patient does not wish to contact the pharmacy document the reason why and proceed with request.) (Agent: If yes, when and what did the pharmacy advise?)  Is this the correct pharmacy for this prescription? Yes If no, delete pharmacy and type the correct one.  This is the patient's preferred pharmacy:  [id number] [place], [place] - [address] AT [facility] [address] [place] [address] Phone: [phone] Fax: [phone]   Has the prescription been filled recently? Yes  Is the patient out of the medication? NO 12 pills  Has the patient been seen for an appointment in the last year OR does the patient have an upcoming appointment? Yes  Can we respond through MyChart? Yes  Agent: Please be advised that Rx refills may take up to 3 business days. We ask that you follow-up with your pharmacy.

## 2023-02-17 MED ORDER — OLMESARTAN MEDOXOMIL-HCTZ 40-25 MG PO TABS: 1.0000 | ORAL_TABLET | Freq: Every day | ORAL | 0 refills | Status: DC

## 2023-02-17 MED ORDER — AMLODIPINE BESYLATE 5 MG PO TABS: 5.0000 mg | ORAL_TABLET | Freq: Every day | ORAL | 0 refills | Status: DC

## 2023-02-17 NOTE — Telephone Encounter (Signed)
 Requested Prescriptions  Pending Prescriptions Disp Refills   amLODipine  (NORVASC ) 5 MG tablet 90 tablet 0    Sig: Take 1 tablet (5 mg total) by mouth daily. TAKE 1 TABLET BY MOUTH DAILY FOR BLOOD PRESSURE     Cardiovascular: Calcium Channel Blockers 2 Passed - 02/17/2023  7:53 AM      Passed - Last BP in normal range    BP Readings from Last 1 Encounters:  11/01/22 128/84         Passed - Last Heart Rate in normal range    Pulse Readings from Last 1 Encounters:  11/01/22 95         Passed - Valid encounter within last 6 months    Recent Outpatient Visits           1 month ago Viral upper respiratory tract infection   [place] [facility] Mecum, Pearla Bottom, PA-C   3 months ago MS (multiple sclerosis) [facility])   [place] [facility] Arleen Lacer, MD   8 months ago MS (multiple sclerosis) [facility])   [place] [facility] Arleen Lacer, MD   11 months ago Benign essential HTN   [place] [facility] Mecum, Pearla Bottom, PA-C   1 year ago Well woman exam   [facility]leen Lacer, [facility]       Future Appointments             In 2 weeks Sowles, Krichna, MD [facility]             olmesartan -hydrochlorothiazide (BENICAR  HCT) 40-25 MG tablet 90 tablet 0    Sig: Take 1 tablet by mouth daily.     Cardiovascular: ARB + Diuretic Combos Passed - 02/17/2023  7:53 AM      Passed - K in normal range and within 180 days    Potassium  Date Value Ref Range Status  11/01/2022 3.7 3.5 - 5.3 mmol/L Final         Passed - Na in normal range and within 180 days    Sodium  Date Value Ref Range Status  11/01/2022 140 135 - 146 mmol/L Final         Passed - Cr in normal range and within 180 days    Creat  Date Value Ref Range Status  11/01/2022 0.95 0.50 - 1.03 mg/dL Final         Passed - eGFR is 10 or above and within 180 days    GFR, Est African American   Date Value Ref Range Status  09/15/2019 71 > OR = 60 mL/min/1.13m2 Final   GFR, Est Non African American  Date Value Ref Range Status  09/15/2019 61 > OR = 60 mL/min/1.27m2 Final   eGFR  Date Value Ref Range Status  11/01/2022 70 > OR = 60 mL/min/1.39m2 Final         Passed - Patient is not pregnant      Passed - Last BP in normal range    BP Readings from Last 1 Encounters:  11/01/22 128/84         Passed - Valid encounter within last 6 months    Recent Outpatient Visits           1 month ago Viral upper respiratory tract infection   [place] [facility] Mecum, Pearla Bottom, PA-C   3 months ago MS (multiple sclerosis) [facility])   [place] [facility] Arleen Lacer, MD   8 months ago MS (multiple sclerosis) [facility])   [place] [facility] Arleen Lacer, MD   11 months ago Benign essential HTN   [facility] Mecum, Pearla Bottom, PA-C   1 year ago Well woman exam   [facility] Arleen Lacer, MD       Future Appointments             In 2 weeks Sowles, Krichna, MD [facility]

## 2023-03-04 ENCOUNTER — Ambulatory Visit: Payer: BC Managed Care – PPO | Admitting: Family Medicine

## 2023-03-04 ENCOUNTER — Encounter: Payer: Self-pay | Admitting: Family Medicine

## 2023-03-04 VITALS — BP 116/74 | HR 94 | Resp 16 | Ht 63.0 in | Wt 228.7 lb

## 2023-03-04 DIAGNOSIS — I10 Essential (primary) hypertension: Secondary | ICD-10-CM

## 2023-03-04 DIAGNOSIS — R634 Abnormal weight loss: Secondary | ICD-10-CM

## 2023-03-04 DIAGNOSIS — E785 Hyperlipidemia, unspecified: Secondary | ICD-10-CM

## 2023-03-04 DIAGNOSIS — E538 Deficiency of other specified B group vitamins: Secondary | ICD-10-CM

## 2023-03-04 DIAGNOSIS — F325 Major depressive disorder, single episode, in full remission: Secondary | ICD-10-CM

## 2023-03-04 DIAGNOSIS — G35 Multiple sclerosis: Secondary | ICD-10-CM | POA: Diagnosis not present

## 2023-03-04 DIAGNOSIS — R7303 Prediabetes: Secondary | ICD-10-CM

## 2023-03-04 DIAGNOSIS — E559 Vitamin D deficiency, unspecified: Secondary | ICD-10-CM

## 2023-03-04 DIAGNOSIS — F5101 Primary insomnia: Secondary | ICD-10-CM

## 2023-03-04 MED ORDER — OLMESARTAN MEDOXOMIL-HCTZ 40-25 MG PO TABS
1.0000 | ORAL_TABLET | Freq: Every day | ORAL | 0 refills | Status: DC
Start: 2023-03-04 — End: 2023-07-18

## 2023-03-04 MED ORDER — AMLODIPINE BESYLATE 5 MG PO TABS: 5.0000 mg | ORAL_TABLET | Freq: Every day | ORAL | 0 refills | Status: DC

## 2023-03-04 MED ORDER — TEMAZEPAM 15 MG PO CAPS: 15.0000 mg | ORAL_CAPSULE | Freq: Every day | ORAL | 0 refills | Status: DC

## 2023-03-04 NOTE — Progress Notes (Signed)
 Name: Deanna Perkins   MRN: [id number]    DOB: 08-20-1965   Date:03/04/2023       Progress Note  Subjective  Chief Complaint  Chief Complaint  Patient presents with   Medical Management of Chronic Issues   HPI   HTN: taking medication daily and denies side effects of medication. No chest pain, palpitation or SOB.BP today is slightly low currently on Benicar hctz and norvasc daily , she denies orthostatic changes    Insomnia:  She takes Temazepam prn, usually every other day, the medication helps her helps her fall and stay asleep for about 7 hours. Stable   MS: diagnosed by Dr. Sherryll Burger June 2023, she is going to PT due to right leg weakness , using a cane, going to see Dr. Sherryll Burger Symptoms going on for years, had seen Ortho, neurosurgeon for right leg pain and finally seen by Dr. Sherryll Burger and diagnosed with MS.  She has ocular symptoms and also right leg weakness and numbness, she has noticed increase in pain , also noticing weakness of right hand and some intermittent swelling.  She is taking Dimethyl Fumarate/Tacfidera  and not sure if it helps with pain.She still has problems with her gait/spastic on right side. She has FMLA so she can take time off for flares but also for doctors visits . Unchanged   Major Depression in Remission she was very depressed from 2008  until 2017. She was diagnosed with MS June 2023 and felt much worse after the diagnosis, hopeless, difficulty focusing, lack of motivation, she took celexa but we switched to Duloxetine due to pain. She is off medications and states emotionally doing well, duloxetine did not help with MS pain  Morbid Obesity: BMI still above 40, but she has lost 20 lbs in the past 2 years but drinking more water, avoiding eating out an cutting down on chocolate.    Pre-diabetes: A1C stable down from 5.8 % to 5.7 % , last level normalized down to 5.6 %  She denies polyphagia, polydipsia or polyuria. Family history of DM. We will recheck it today     Dyslipidemia: we will continue to monitor    The 10-year ASCVD risk score (Arnett DK, et al., 2019) is: 3.3%   Values used to calculate the score:     Age: 58 years     Sex: Female     Is Non-Hispanic African American: Yes     Diabetic: No     Tobacco smoker: No     Systolic Blood Pressure: 116 mmHg     Is BP treated: Yes     HDL Cholesterol: 68 mg/dL     Total Cholesterol: 210 mg/dL    W09 deficiency and vitamin D deficiency: continue supplementation , last vitamin D level was low  Weight loss:  in 2018 she was in the 260 lbs range, gradually lost and was stable in the mid 140 lbs range until last year and is now down to 228.7 lbs. She states only change has been drinking more water and eating out less. She denies indigestion, change in bowel movements, SOB . She is due for mammogram and colonoscopy . Advised her to schedule screening tests to rule out cancer   Wt Readings from Last 5 Encounters:  03/04/23 228 lb 11.2 oz (103.7 kg)  11/01/22 232 lb (105.2 kg)  06/18/22 236 lb (107 kg)  03/18/22 239 lb 8 oz (108.6 kg)  11/06/21 240 lb 3.2 oz (109 kg)   BMI Readings  from Last 5 Encounters:  03/04/23 40.51 kg/m  11/01/22 41.10 kg/m  06/18/22 41.81 kg/m  03/18/22 41.11 kg/m  11/06/21 41.23 kg/m     Patient Active Problem List   Diagnosis Date Noted   MS (multiple sclerosis) (HCC) 08/20/2021   Other neutropenia (HCC) 03/17/2018   Depression, major, in remission (HCC) 03/17/2018   Chronic right hip pain 03/17/2018   Morbid obesity (HCC) 09/22/2017   Pre-diabetes 11/19/2015   Obesity, Class III, BMI 40-49.9 (morbid obesity) (HCC) 09/20/2014   Back pain, chronic 09/17/2014   Benign essential HTN 09/17/2014   History of cataract surgery 09/17/2014   Dyslipidemia 09/17/2014   Iridocyclitis 09/17/2014   Osteoarthritis of both knees 09/17/2014   History of menorrhagia 09/17/2014   Insomnia 09/17/2014   Vitamin D deficiency 09/17/2014    Past Surgical History:   Procedure Laterality Date   BREAST SURGERY     reduction   CERVICAL CERCLAGE     DILATION AND CURETTAGE OF UTERUS  2008   EYE SURGERY Bilateral    cataracts   foot suregry      Family History  Problem Relation Age of Onset   Diabetes Mother    Hypertension Mother    Stroke Mother    Cancer Father        lung   Hypertension Father    Cancer Maternal Aunt        breast   Stroke Sister     Social History   Tobacco Use   Smoking status: Never   Smokeless tobacco: Never  Substance Use Topics   Alcohol use: Yes    Alcohol/week: 0.0 standard drinks of alcohol    Comment: rarely     Current Outpatient Medications:    amLODipine (NORVASC) 5 MG tablet, Take 1 tablet (5 mg total) by mouth daily. TAKE 1 TABLET BY MOUTH DAILY FOR BLOOD PRESSURE, Disp: 90 tablet, Rfl: 0   Cholecalciferol (VITAMIN D3) 50 MCG (2000 UT) capsule, Take 1 capsule (2,000 Units total) by mouth daily., Disp: 100 capsule, Rfl: 1   Cyanocobalamin (B-12) 1000 MCG SUBL, Place 1 tablet under the tongue 3 (three) times a week., Disp: 30 tablet, Rfl: 0   Dimethyl Fumarate 240 MG CPDR, Take 240 mg by mouth daily at 2 PM., Disp: , Rfl:    DULoxetine (CYMBALTA) 30 MG capsule, Take 1 capsule (30 mg total) by mouth daily., Disp: 30 capsule, Rfl: 0   DULoxetine (CYMBALTA) 60 MG capsule, Take 1 capsule (60 mg total) by mouth daily., Disp: 90 capsule, Rfl: 0   olmesartan-hydrochlorothiazide (BENICAR HCT) 40-25 MG tablet, Take 1 tablet by mouth daily., Disp: 90 tablet, Rfl: 0   temazepam (RESTORIL) 15 MG capsule, Take 1 capsule (15 mg total) by mouth at bedtime., Disp: 90 capsule, Rfl: 0  Allergies  Allergen Reactions   Chocolate Hives    I personally reviewed active problem list, medication list, allergies with the patient/caregiver today.   ROS  Ten systems reviewed and is negative except as mentioned in HPI    Objective  Vitals:   03/04/23 1521  BP: 116/74  Pulse: 94  Resp: 16  SpO2: 99%  Weight: 228  lb 11.2 oz (103.7 kg)  Height: 5\' 3"  (1.6 m)    Body mass index is 40.51 kg/m.  Physical Exam  Constitutional: Patient appears well-developed and well-nourished. Obese  No distress.  HEENT: head atraumatic, normocephalic, pupils equal and reactive to light, neck supple Cardiovascular: Normal rate, regular rhythm and normal heart sounds.  No murmur heard. No BLE edema. Pulmonary/Chest: Effort normal and breath sounds normal. No respiratory distress. Abdominal: Soft.  There is no tenderness. Psychiatric: Patient has a normal mood and affect. behavior is normal. Judgment and thought content normal.   Diabetic Foot Exam:     PHQ2/9:    03/04/2023    3:19 PM 11/01/2022    2:43 PM 06/18/2022    2:16 PM 03/18/2022   10:10 AM 11/06/2021    1:26 PM  Depression screen PHQ 2/9  Decreased Interest 0 0 0 0 0  Down, Depressed, Hopeless 0 0 0 0 1  PHQ - 2 Score 0 0 0 0 1  Altered sleeping 0 0 0 2 0  Tired, decreased energy 0 1 0 1 0  Change in appetite 0 0 0 0 0  Feeling bad or failure about yourself  0 1 0 0 0  Trouble concentrating 0 0 0 0 0  Moving slowly or fidgety/restless 0 0 0 1 0  Suicidal thoughts 0 0 0 0 0  PHQ-9 Score 0 2 0 4 1  Difficult doing work/chores Not difficult at all    Not difficult at all    phq 9 is negative  Fall Risk:    11/01/2022    2:43 PM 06/18/2022    2:16 PM 03/18/2022   10:10 AM 11/06/2021    1:26 PM 08/20/2021    3:14 PM  Fall Risk   Falls in the past year? 0 0 0 1 1  Number falls in past yr: 0 0 0 1 1  Injury with Fall? 0 0 0 1 0  Risk for fall due to : No Fall Risks Impaired balance/gait;Impaired mobility  History of fall(s) No Fall Risks  Follow up Falls prevention discussed Falls prevention discussed  Falls prevention discussed;Education provided;Falls evaluation completed Falls prevention discussed     Assessment & Plan  1. MS (multiple sclerosis) (HCC) (Primary)  Up to date with Dr. Sherryll Burger  2. Depression, major, in remission  (HCC)  Not on medication  3. Morbid obesity (HCC)  Discussed with the patient the risk posed by an increased BMI. Discussed importance of portion control, calorie counting and at least 150 minutes of physical activity weekly. Avoid sweet beverages and drink more water. Eat at least 6 servings of fruit and vegetables daily    4. Benign essential HTN = - olmesartan-hydrochlorothiazide (BENICAR HCT) 40-25 MG tablet; Take 1 tablet by mouth daily.  Dispense: 90 tablet; Refill: 0 - amLODipine (NORVASC) 5 MG tablet; Take 1 tablet (5 mg total) by mouth daily. TAKE 1 TABLET BY MOUTH DAILY FOR BLOOD PRESSURE  Dispense: 90 tablet; Refill: 0  5. Pre-diabetes  Doing well with life style modification  6. Primary insomnia  - temazepam (RESTORIL) 15 MG capsule; Take 1 capsule (15 mg total) by mouth at bedtime.  Dispense: 90 capsule; Refill: 0  7. Dyslipidemia  Recheck labs yearly  8. B12 deficiency  Continue b12 supplements  9. Vitamin D deficiency  Continue supplementation  10. Weight loss  Likely from life style modification but needs to schedule mammogram and colonoscopy

## 2023-03-24 DIAGNOSIS — H35032 Hypertensive retinopathy, left eye: Secondary | ICD-10-CM | POA: Diagnosis not present

## 2023-03-24 DIAGNOSIS — H26491 Other secondary cataract, right eye: Secondary | ICD-10-CM | POA: Diagnosis not present

## 2023-04-08 DIAGNOSIS — Z8669 Personal history of other diseases of the nervous system and sense organs: Secondary | ICD-10-CM | POA: Diagnosis not present

## 2023-04-08 DIAGNOSIS — R519 Headache, unspecified: Secondary | ICD-10-CM | POA: Diagnosis not present

## 2023-04-08 DIAGNOSIS — H9313 Tinnitus, bilateral: Secondary | ICD-10-CM | POA: Diagnosis not present

## 2023-04-08 DIAGNOSIS — G35 Multiple sclerosis: Secondary | ICD-10-CM | POA: Diagnosis not present

## 2023-07-18 ENCOUNTER — Encounter: Payer: Self-pay | Admitting: Family Medicine

## 2023-07-18 ENCOUNTER — Ambulatory Visit: Payer: BC Managed Care – PPO | Admitting: Family Medicine

## 2023-07-18 VITALS — BP 132/82 | HR 86 | Resp 16 | Ht 63.0 in | Wt 233.5 lb

## 2023-07-18 DIAGNOSIS — F5101 Primary insomnia: Secondary | ICD-10-CM

## 2023-07-18 DIAGNOSIS — F330 Major depressive disorder, recurrent, mild: Secondary | ICD-10-CM

## 2023-07-18 DIAGNOSIS — I10 Essential (primary) hypertension: Secondary | ICD-10-CM

## 2023-07-18 DIAGNOSIS — G35 Multiple sclerosis: Secondary | ICD-10-CM

## 2023-07-18 DIAGNOSIS — E538 Deficiency of other specified B group vitamins: Secondary | ICD-10-CM

## 2023-07-18 DIAGNOSIS — E559 Vitamin D deficiency, unspecified: Secondary | ICD-10-CM | POA: Diagnosis not present

## 2023-07-18 DIAGNOSIS — R7303 Prediabetes: Secondary | ICD-10-CM

## 2023-07-18 DIAGNOSIS — E785 Hyperlipidemia, unspecified: Secondary | ICD-10-CM

## 2023-07-18 DIAGNOSIS — Z1231 Encounter for screening mammogram for malignant neoplasm of breast: Secondary | ICD-10-CM

## 2023-07-18 DIAGNOSIS — Z1211 Encounter for screening for malignant neoplasm of colon: Secondary | ICD-10-CM

## 2023-07-18 MED ORDER — OLMESARTAN MEDOXOMIL-HCTZ 40-25 MG PO TABS: 1.0000 | ORAL_TABLET | Freq: Every day | ORAL | 1 refills | Status: DC

## 2023-07-18 MED ORDER — TEMAZEPAM 15 MG PO CAPS: 15.0000 mg | ORAL_CAPSULE | Freq: Every day | ORAL | 1 refills | Status: DC

## 2023-07-18 MED ORDER — AMLODIPINE BESYLATE 5 MG PO TABS: 5.0000 mg | ORAL_TABLET | Freq: Every day | ORAL | 1 refills | Status: DC

## 2023-07-18 NOTE — Progress Notes (Signed)
 Name: Deanna Perkins   MRN: [id number]    DOB: 1965/08/26   Date:07/18/2023       Progress Note  Subjective  Chief Complaint  Chief Complaint  Patient presents with   Medical Management of Chronic Issues   Discussed the use of AI scribe software for clinical note transcription with the patient, who gave verbal consent to proceed.  History of Present Illness Deanna Perkins is a 58 year old female with multiple sclerosis who presents with unemployment and stress-related issues.  She lost her job in April due to a conflict with her supervisor, which was not by choice. She is currently unemployed.  She has submitted paperwork for disability benefits and is awaiting a decision. She finds it difficult to secure employment due to her medical condition, particularly her inability to work with computers because of her multiple sclerosis that causes eye symptoms  Her multiple sclerosis symptoms primarily affect her right leg, necessitating the use of a cane due to weakness. Stress and heat exacerbate her symptoms, and her vision is also impaired, complicating computer work. Recently, her symptoms have begun to affect her left leg as well, she thinks due to increase in stress. She is up to date with visits to neurologist and is compliant with her medications  She is experiencing increased stress and depression since becoming unemployed, noting that she has 'too much time on her hands' and that it 'gets in your head.' Her PHQ-9 score was six. She is not currently taking antidepressants but uses temazepam  for sleep, which now provides about five hours of sleep, down from seven to eight hours previously. She prefers not adding medications due to losing her insurance at the end of this month and afraid she will not be able to afford it also not able to come in for follow up as often  She is concerned about her financial situation as her insurance is ending soon, and she is in the process of applying for Medicaid  with the help of her niece. She is currently taking medications for multiple sclerosis, hypertension (amlodipine  and olmesartan  HCTZ), and supplements for B12 and vitamin D  deficiency. She manages prediabetes with lifestyle modifications, with her A1c ranging between 5.6 and 5.9.  She has a history of iridocyclitis affecting her vision. Her vision issues make it difficult to perform her previous job, which involved sitting in front of a computer.   She also mentions a subcutaneous nodule that is not painful or growing rapidly on right anterior lower leg.  Her social history includes being previously employed and now adjusting to unemployment. She is trying to maintain a routine by going for walks and engaging in daily activities, although she struggles with establishing a consistent sleep schedule.    The 10-year ASCVD risk score (Arnett DK, et al., 2019) is: 5.5%   Values used to calculate the score:     Age: 59 years     Clincally relevant sex: Female     Is Non-Hispanic African American: Yes     Diabetic: No     Tobacco smoker: No     Systolic Blood Pressure: 132 mmHg     Is BP treated: Yes     HDL Cholesterol: 68 mg/dL     Total Cholesterol: 210 mg/dL    Patient Active Problem List   Diagnosis Date Noted   MS (multiple sclerosis) (HCC) 08/20/2021   Other neutropenia (HCC) 03/17/2018   Depression, major, in remission (HCC) 03/17/2018   Chronic right  hip pain 03/17/2018   Morbid obesity (HCC) 09/22/2017   Pre-diabetes 11/19/2015   Obesity, Class III, BMI 40-49.9 (morbid obesity) 09/20/2014   Back pain, chronic 09/17/2014   Benign essential HTN 09/17/2014   History of cataract surgery 09/17/2014   Dyslipidemia 09/17/2014   Iridocyclitis 09/17/2014   Osteoarthritis of both knees 09/17/2014   History of menorrhagia 09/17/2014   Insomnia 09/17/2014   Vitamin D  deficiency 09/17/2014    Past Surgical History:  Procedure Laterality Date   BREAST SURGERY     reduction    CERVICAL CERCLAGE     DILATION AND CURETTAGE OF UTERUS  2008   EYE SURGERY Bilateral    cataracts   foot suregry      Family History  Problem Relation Age of Onset   Diabetes Mother    Hypertension Mother    Stroke Mother    Cancer Father        lung   Hypertension Father    Cancer Maternal Aunt        breast   Stroke Sister     Social History   Tobacco Use   Smoking status: Never   Smokeless tobacco: Never  Substance Use Topics   Alcohol use: Yes    Alcohol/week: 0.0 standard drinks of alcohol    Comment: rarely     Current Outpatient Medications:    amLODipine  (NORVASC ) 5 MG tablet, Take 1 tablet (5 mg total) by mouth daily. TAKE 1 TABLET BY MOUTH DAILY FOR BLOOD PRESSURE, Disp: 90 tablet, Rfl: 0   Cholecalciferol (VITAMIN D3) 50 MCG (2000 UT) capsule, Take 1 capsule (2,000 Units total) by mouth daily., Disp: 100 capsule, Rfl: 1   Cyanocobalamin  (B-12) 1000 MCG SUBL, Place 1 tablet under the tongue 3 (three) times a week., Disp: 30 tablet, Rfl: 0   Dimethyl Fumarate 240 MG CPDR, Take 240 mg by mouth daily at 2 PM., Disp: , Rfl:    olmesartan -hydrochlorothiazide (BENICAR  HCT) 40-25 MG tablet, Take 1 tablet by mouth daily., Disp: 90 tablet, Rfl: 0   temazepam  (RESTORIL ) 15 MG capsule, Take 1 capsule (15 mg total) by mouth at bedtime., Disp: 90 capsule, Rfl: 0  Allergies  Allergen Reactions   Chocolate Hives    I personally reviewed active problem list, medication list, allergies, family history with the patient/caregiver today.   ROS  Ten systems reviewed and is negative except as mentioned in HPI    Objective Physical Exam CONSTITUTIONAL: Patient appears well-developed and well-nourished.  No distress. HEENT: Head atraumatic, normocephalic, neck supple. CARDIOVASCULAR: Normal rate, regular rhythm and normal heart sounds.  No murmur heard. No BLE edema. PULMONARY: Effort normal and breath sounds normal. No respiratory distress. ABDOMINAL: There is no  tenderness or distention. Skin: small subcutaneous nodule on right lower anterior leg - seems to be a cyst or scar PSYCHIATRIC: Patient has a normal mood and affect. behavior is normal. Judgment and thought content normal.  Vitals:   07/18/23 1507  BP: 132/82  Pulse: 86  Resp: 16  SpO2: 96%  Weight: 233 lb 8 oz (105.9 kg)  Height: 5' 3 (1.6 m)    Body mass index is 41.36 kg/m.   PHQ2/9:    07/18/2023    3:05 PM 03/04/2023    3:19 PM 11/01/2022    2:43 PM 06/18/2022    2:16 PM 03/18/2022   10:10 AM  Depression screen PHQ 2/9  Decreased Interest 1 0 0 0 0  Down, Depressed, Hopeless 1 0 0 0 0  PHQ - 2 Score 2 0 0 0 0  Altered sleeping 1 0 0 0 2  Tired, decreased energy 1 0 1 0 1  Change in appetite 1 0 0 0 0  Feeling bad or failure about yourself  0 0 1 0 0  Trouble concentrating 1 0 0 0 0  Moving slowly or fidgety/restless 0 0 0 0 1  Suicidal thoughts 0 0 0 0 0  PHQ-9 Score 6 0 2 0 4  Difficult doing work/chores Very difficult Not difficult at all       phq 9 is positive  Fall Risk:    07/18/2023    3:02 PM 11/01/2022    2:43 PM 06/18/2022    2:16 PM 03/18/2022   10:10 AM 11/06/2021    1:26 PM  Fall Risk   Falls in the past year? 0 0 0 0 1  Number falls in past yr: 0 0 0 0 1  Injury with Fall? 0 0 0 0 1  Risk for fall due to : No Fall Risks No Fall Risks Impaired balance/gait;Impaired mobility  History of fall(s)  Follow up Falls evaluation completed Falls prevention discussed Falls prevention discussed  Falls prevention discussed;Education provided;Falls evaluation completed      Data saved with a previous flowsheet row definition    Assessment & Plan Multiple sclerosis with right leg and visual involvement Chronic condition with right leg weakness and visual strain exacerbated by stress and heat. Unemployed and applying for disability due to MS impact. - Encouraged Medicaid application for medication cost assistance. - Advised contacting neurologist to  reschedule appointment for continuity of care. - Documented MS symptoms for disability application.  Depression Major recurrent mild  Depression exacerbated by unemployment and stress. PHQ-9 score of 6. Not on antidepressants due to insurance concerns. Temazepam  provides limited sleep relief due to stress. - Discuss potential for antidepressant therapy if insurance allows. - Encourage establishing a daily routine to manage symptoms.  Chronic insomnia Chronic insomnia partially managed with temazepam . Stress and lack of routine contribute to reduced sleep duration. - Continue temazepam  for sleep. - Encourage establishing a consistent sleep routine.  Hypertension Hypertension managed with amlodipine  and olmesartan  HCTZ.  Morbid obesity Morbid obesity persists,BMI over 40  but not the primary focus due to other health concerns.  History of prediabetes A1c levels range from 5.6 to 5.9. No current diabetes symptoms. - Order blood work to monitor glucose levels.  Vitamin D  deficiency Vitamin D  deficiency managed with supplementation. - Order blood work to monitor vitamin D  levels.  Vitamin B12 deficiency, borderline/low Borderline/low vitamin B12 levels managed with supplementation. - Order blood work to monitor B12 levels.  Chronic right hip pain Chronic right hip pain associated with MS.  Subcutaneous nodule, unspecified site Subcutaneous nodule likely a cyst or scar tissue. Not painful or growing rapidly, not of neurological concern.

## 2023-07-19 LAB — CBC WITH DIFFERENTIAL/PLATELET
Absolute Lymphocytes: 1463 {cells}/uL (ref 850–3900)
Absolute Monocytes: 479 {cells}/uL (ref 200–950)
Basophils Absolute: 50 {cells}/uL (ref 0–200)
Basophils Relative: 0.9 %
Eosinophils Absolute: 72 {cells}/uL (ref 15–500)
Eosinophils Relative: 1.3 %
HCT: 43.2 % (ref 35.0–45.0)
Hemoglobin: 14.5 g/dL (ref 11.7–15.5)
MCH: 29.8 pg (ref 27.0–33.0)
MCHC: 33.6 g/dL (ref 32.0–36.0)
MCV: 88.9 fL (ref 80.0–100.0)
MPV: 10.6 fL (ref 7.5–12.5)
Monocytes Relative: 8.7 %
Neutro Abs: 3438 {cells}/uL (ref 1500–7800)
Neutrophils Relative %: 62.5 %
Platelets: 338 Thousand/uL (ref 140–400)
RBC: 4.86 Million/uL (ref 3.80–5.10)
RDW: 13.5 % (ref 11.0–15.0)
Total Lymphocyte: 26.6 %
WBC: 5.5 Thousand/uL (ref 3.8–10.8)

## 2023-07-19 LAB — COMPREHENSIVE METABOLIC PANEL WITH GFR
AG Ratio: 1.3 (calc) (ref 1.0–2.5)
ALT: 22 U/L (ref 6–29)
AST: 20 U/L (ref 10–35)
Albumin: 4.4 g/dL (ref 3.6–5.1)
Alkaline phosphatase (APISO): 94 U/L (ref 37–153)
BUN/Creatinine Ratio: 13 (calc) (ref 6–22)
BUN: 15 mg/dL (ref 7–25)
CO2: 26 mmol/L (ref 20–32)
Calcium: 9.7 mg/dL (ref 8.6–10.4)
Chloride: 103 mmol/L (ref 98–110)
Creat: 1.15 mg/dL — ABNORMAL HIGH (ref 0.50–1.03)
Globulin: 3.4 g/dL (ref 1.9–3.7)
Glucose, Bld: 86 mg/dL (ref 65–139)
Potassium: 3.5 mmol/L (ref 3.5–5.3)
Sodium: 138 mmol/L (ref 135–146)
Total Bilirubin: 0.7 mg/dL (ref 0.2–1.2)
Total Protein: 7.8 g/dL (ref 6.1–8.1)
eGFR: 56 mL/min/1.73m2 — ABNORMAL LOW (ref >=60)

## 2023-07-19 LAB — B12 AND FOLATE PANEL
Folate: 7.6 ng/mL
Vitamin B-12: 450 pg/mL (ref 200–1100)

## 2023-07-19 LAB — HEMOGLOBIN A1C
Hgb A1c MFr Bld: 5.6 % (ref <5.7)
Mean Plasma Glucose: 114 mg/dL
eAG (mmol/L): 6.3 mmol/L

## 2023-07-19 LAB — VITAMIN D 25 HYDROXY (VIT D DEFICIENCY, FRACTURES): Vit D, 25-Hydroxy: 20 ng/mL — ABNORMAL LOW (ref 30–100)

## 2023-07-22 ENCOUNTER — Ambulatory Visit: Payer: Self-pay | Admitting: Family Medicine

## 2023-10-13 DIAGNOSIS — H35352 Cystoid macular degeneration, left eye: Secondary | ICD-10-CM | POA: Diagnosis not present

## 2023-10-13 DIAGNOSIS — H3022 Posterior cyclitis, left eye: Secondary | ICD-10-CM | POA: Diagnosis not present

## 2023-10-17 DIAGNOSIS — H5213 Myopia, bilateral: Secondary | ICD-10-CM | POA: Diagnosis not present

## 2023-11-17 DIAGNOSIS — Z8669 Personal history of other diseases of the nervous system and sense organs: Secondary | ICD-10-CM | POA: Diagnosis not present

## 2023-11-17 DIAGNOSIS — G35A Relapsing-remitting multiple sclerosis: Secondary | ICD-10-CM | POA: Diagnosis not present

## 2023-11-17 DIAGNOSIS — Z9181 History of falling: Secondary | ICD-10-CM | POA: Diagnosis not present

## 2023-11-17 DIAGNOSIS — Z1331 Encounter for screening for depression: Secondary | ICD-10-CM | POA: Diagnosis not present

## 2024-01-19 ENCOUNTER — Ambulatory Visit: Admitting: Family Medicine

## 2024-01-19 ENCOUNTER — Encounter: Payer: Self-pay | Admitting: Family Medicine

## 2024-01-19 VITALS — BP 118/74 | HR 75 | Resp 16 | Ht 63.0 in | Wt 218.7 lb

## 2024-01-19 DIAGNOSIS — E559 Vitamin D deficiency, unspecified: Secondary | ICD-10-CM | POA: Diagnosis not present

## 2024-01-19 DIAGNOSIS — G35D Multiple sclerosis, unspecified: Secondary | ICD-10-CM | POA: Diagnosis not present

## 2024-01-19 DIAGNOSIS — F5101 Primary insomnia: Secondary | ICD-10-CM

## 2024-01-19 DIAGNOSIS — I10 Essential (primary) hypertension: Secondary | ICD-10-CM

## 2024-01-19 DIAGNOSIS — R7303 Prediabetes: Secondary | ICD-10-CM

## 2024-01-19 DIAGNOSIS — F331 Major depressive disorder, recurrent, moderate: Secondary | ICD-10-CM | POA: Diagnosis not present

## 2024-01-19 DIAGNOSIS — E538 Deficiency of other specified B group vitamins: Secondary | ICD-10-CM | POA: Diagnosis not present

## 2024-01-19 MED ORDER — OLMESARTAN MEDOXOMIL-HCTZ 40-25 MG PO TABS: 1.0000 | ORAL_TABLET | Freq: Every day | ORAL | 1 refills | Status: AC

## 2024-01-19 MED ORDER — QUETIAPINE FUMARATE 25 MG PO TABS: 25.0000 mg | ORAL_TABLET | Freq: Every day | ORAL | 0 refills | Status: AC

## 2024-01-19 MED ORDER — AMLODIPINE BESYLATE 5 MG PO TABS: 5.0000 mg | ORAL_TABLET | Freq: Every day | ORAL | 0 refills | Status: AC

## 2024-01-19 MED ORDER — DULOXETINE HCL 30 MG PO CPEP: 30.0000 mg | ORAL_CAPSULE | Freq: Every day | ORAL | 0 refills | Status: AC

## 2024-01-19 NOTE — Progress Notes (Unsigned)
 Name: Deanna Perkins   MRN: [id number]    DOB: 1965-10-16   Date:01/19/2024       Progress Note  Subjective  Chief Complaint  Chief Complaint  Patient presents with   Medical Management of Chronic Issues   Discussed the use of AI scribe software for clinical note transcription with the patient, who gave verbal consent to proceed.  History of Present Illness Deanna Perkins is a 59 year old female with major depression and multiple sclerosis who presents for medication management and follow-up.  She is experiencing significant stress and depression due to unemployment and financial difficulties. She feels 'really worried' and 'still worried' about her situation of being unemployed and waiting for social security benefits. She is not currently taking any medication for her depression, although she previously found duloxetine  helpful for both pain and energy.  She has a history of major depression, recurrent moderate, and is not currently on any antidepressant medication. She is experiencing sleep disturbances and was previously prescribed temazepam , which is not fully covered by Medicaid. She has not taken trazodone before.  She has multiple sclerosis and was previously on dimethyl fumarate. She is in the process of resuming this medication after a lapse due to insurance issues. She is currently waiting for the medication to be available through a specialty pharmacy.  She takes amlodipine  5 mg and olmesartan  HCTZ 40/25 mg for hypertension, and she takes them simultaneously. No palpitations, chest pain, or shortness of breath.  She has prediabetes and has experienced weight loss, which she attributes to stress and financial concerns. Her BMI is now below 40, but she acknowledges that the weight loss is not due to healthy lifestyle changes.  She uses a cane for mobility due to multiple sclerosis and arthritis, particularly affecting her right leg, which she drags more.  Her social history  includes living alone and receiving some financial support from relatives. She has lost her car and relies on friends and family for transportation. She has a supportive network, including family and friends from her previous job.    Patient Active Problem List   Diagnosis Date Noted   MS (multiple sclerosis) 08/20/2021   Depression, major, in remission 03/17/2018   Chronic right hip pain 03/17/2018   Morbid obesity (HCC) 09/22/2017   Pre-diabetes 11/19/2015   Obesity, Class III, BMI 40-49.9 (morbid obesity) (HCC) 09/20/2014   Back pain, chronic 09/17/2014   Benign essential HTN 09/17/2014   History of cataract surgery 09/17/2014   Dyslipidemia 09/17/2014   Iridocyclitis 09/17/2014   Osteoarthritis of both knees 09/17/2014   History of menorrhagia 09/17/2014   Insomnia 09/17/2014   Vitamin D  deficiency 09/17/2014    Past Surgical History:  Procedure Laterality Date   BREAST SURGERY     reduction   CERVICAL CERCLAGE     DILATION AND CURETTAGE OF UTERUS  2008   EYE SURGERY Bilateral    cataracts   foot suregry      Family History  Problem Relation Age of Onset   Diabetes Mother    Hypertension Mother    Stroke Mother    Cancer Father        lung   Hypertension Father    Cancer Maternal Aunt        breast   Stroke Sister     Social History   Tobacco Use   Smoking status: Never   Smokeless tobacco: Never  Substance Use Topics   Alcohol use: Yes    Alcohol/week: 0.0  standard drinks of alcohol    Comment: rarely    Current Medications[1]  Allergies[2]  I personally reviewed active problem list, medication list, allergies, family history with the patient/caregiver today.   ROS  Ten systems reviewed and is negative except as mentioned in HPI    Objective Physical Exam VITALS: BP- 118/74 CONSTITUTIONAL: Patient appears well-developed and well-nourished. No distress. HEENT: Head atraumatic, normocephalic, neck supple. CARDIOVASCULAR: Normal rate, regular  rhythm and normal heart sounds. No murmur heard. No BLE edema. PULMONARY: Effort normal and breath sounds normal. No respiratory distress. ABDOMINAL: There is no tenderness or distention. MUSCULOSKELETAL: Normal gait. Without gross motor or sensory deficit. PSYCHIATRIC: Patient has a normal mood and affect. Behavior is normal. Judgment and thought content normal.  Vitals:   01/19/24 1537  BP: 118/74  Pulse: 75  Resp: 16  SpO2: 98%  Weight: 218 lb 11.2 oz (99.2 kg)  Height: 5' 3 (1.6 m)    Body mass index is 38.74 kg/m.    PHQ2/9:    01/19/2024    3:36 PM 07/18/2023    3:05 PM 03/04/2023    3:19 PM 11/01/2022    2:43 PM 06/18/2022    2:16 PM  Depression screen PHQ 2/9  Decreased Interest 3 1 0 0 0  Down, Depressed, Hopeless 3 1 0 0 0  PHQ - 2 Score 6 2 0 0 0  Altered sleeping 3 1 0 0 0  Tired, decreased energy 0 1 0 1 0  Change in appetite 0 1 0 0 0  Feeling bad or failure about yourself  0 0 0 1 0  Trouble concentrating 3 1 0 0 0  Moving slowly or fidgety/restless 0 0 0 0 0  Suicidal thoughts 0 0 0 0 0  PHQ-9 Score 12 6  0  2  0   Difficult doing work/chores Very difficult Very difficult Not difficult at all       Data saved with a previous flowsheet row definition    phq 9 is positive  Fall Risk:    01/19/2024    3:28 PM 07/18/2023    3:02 PM 11/01/2022    2:43 PM 06/18/2022    2:16 PM 03/18/2022   10:10 AM  Fall Risk   Falls in the past year? 1 0 0 0 0  Number falls in past yr: 1 0 0 0 0  Injury with Fall? 0 0  0  0  0   Risk for fall due to : Impaired balance/gait No Fall Risks No Fall Risks Impaired balance/gait;Impaired mobility   Follow up Falls evaluation completed Falls evaluation completed Falls prevention discussed Falls prevention discussed      Data saved with a previous flowsheet row definition    Assessment & Plan Major depressive disorder, recurrent, moderate Significant stress and depression due to financial instability and loss of  independence. Duloxetine  considered for depression and pain management. Quetiapine  considered for sleep issues and depression. - Prescribed duloxetine , 30 mg for one week, then 60 mg. - Prescribed quetiapine  for sleep, one tablet daily, increasing to two tablets after one week if tolerated. - Scheduled follow-up virtual visit in one month to assess medication efficacy.  Multiple sclerosis Off dimethyl fumarate due to insurance issues. Coordination with Dr. Loreli necessary for medication availability. - Contact Dr. Loreli to ensure medication transfer from [facility] to specialty pharmacy. - Ensure continuous medication supply to manage MS symptoms.  Primary insomnia Sleep issues previously managed with temazepam , not covered by Medicaid.  Quetiapine  considered as alternative. - Prescribed quetiapine  for sleep, one tablet daily, increasing to two tablets after one week if tolerated.  Essential hypertension Blood pressure well-controlled at 118/74 mmHg on amlodipine  and olmesartan  HCTZ. - Continue current antihypertensive regimen with amlodipine  and olmesartan  HCTZ. - Advised taking amlodipine  with sleep medication at night to avoid hypotension.  Morbid obesity BMI decreased but remains above 35. Weight loss due to stress and financial concerns. - Focus on nutritional intake to prevent energy depletion and fatigue. - Encouraged exposure to sunlight to aid in mood improvement.  Prediabetes Weight loss beneficial but should not be stress-related. Emphasis on healthy weight management. - Encouraged healthy lifestyle changes to manage prediabetes.  Vitamin B12 deficiency Previously on supplements but stopped due to lack of motivation and financial constraints. - Resume vitamin B12 supplementation before checking blood work.  Vitamin D  deficiency Previously on supplements but stopped due to lack of motivation and financial constraints. - Resume vitamin D  supplementation before checking blood  work.  General health maintenance Has not completed Cologuard and mammogram screenings. Coordination with Medicaid for coverage necessary. - Contact Medicaid to confirm coverage for Cologuard and mammogram. - Encourage scheduling mammogram with a friend for convenience.        [1]  Current Outpatient Medications:    amLODipine  (NORVASC ) 5 MG tablet, Take 1 tablet (5 mg total) by mouth daily. TAKE 1 TABLET BY MOUTH DAILY FOR BLOOD PRESSURE, Disp: 90 tablet, Rfl: 1   Cholecalciferol (VITAMIN D3) 50 MCG (2000 UT) capsule, Take 1 capsule (2,000 Units total) by mouth daily., Disp: 100 capsule, Rfl: 1   Cyanocobalamin  (B-12) 1000 MCG SUBL, Place 1 tablet under the tongue 3 (three) times a week., Disp: 30 tablet, Rfl: 0   Dimethyl Fumarate 120 MG CPDR, Take by mouth., Disp: , Rfl:    olmesartan -hydrochlorothiazide (BENICAR  HCT) 40-25 MG tablet, Take 1 tablet by mouth daily., Disp: 90 tablet, Rfl: 1   temazepam  (RESTORIL ) 15 MG capsule, Take 1 capsule (15 mg total) by mouth at bedtime., Disp: 90 capsule, Rfl: 1   Dimethyl Fumarate 240 MG CPDR, Take 240 mg by mouth daily at 2 PM. (Patient not taking: Reported on 01/19/2024), Disp: , Rfl:  [2]  Allergies Allergen Reactions   Chocolate Hives

## 2024-01-20 ENCOUNTER — Telehealth: Payer: Self-pay | Admitting: Pharmacy Technician

## 2024-01-20 ENCOUNTER — Other Ambulatory Visit (HOSPITAL_COMMUNITY): Payer: Self-pay

## 2024-01-20 NOTE — Telephone Encounter (Signed)
 Pharmacy Patient Advocate Encounter  Received notification from [facility] MEDICAID that Prior Authorization for QUEtiapine  Fumarate 25MG  tablets has been APPROVED from 01/20/24 to 01/19/25. Ran test claim, Copay is $4.00. This test claim was processed through [facility]- copay amounts may vary at other pharmacies due to pharmacy/plan contracts, or as the patient moves through the different stages of their insurance plan.   PA #/Case ID/Reference #: [id number]

## 2024-01-20 NOTE — Telephone Encounter (Signed)
 Pharmacy Patient Advocate Encounter   Received notification from [employer] that prior authorization for QUEtiapine  Fumarate 25MG  tablets  is required/requested.   Insurance verification completed.   The patient is insured through [facility] MEDICAID.   Per test claim: PA required; PA started via CoverMyMeds. KEY [id number] . Waiting for clinical questions to populate.

## 2024-02-19 ENCOUNTER — Ambulatory Visit: Admitting: Family Medicine
